# Patient Record
Sex: Female | Born: 1965 | Race: Black or African American | Hispanic: No | Marital: Married | Smoking: Current every day smoker
Health system: Southern US, Community
[De-identification: ages and names within clinical notes are randomized; demographics above are authoritative.]

## PROBLEM LIST (undated history)

## (undated) DIAGNOSIS — E119 Type 2 diabetes mellitus without complications: Secondary | ICD-10-CM

## (undated) DIAGNOSIS — I10 Essential (primary) hypertension: Secondary | ICD-10-CM

## (undated) DIAGNOSIS — G4730 Sleep apnea, unspecified: Secondary | ICD-10-CM

## (undated) HISTORY — PX: TUBAL LIGATION: SHX77

## (undated) HISTORY — PX: WISDOM TOOTH EXTRACTION: SHX21

## (undated) HISTORY — PX: CHOLECYSTECTOMY: SHX55

## (undated) HISTORY — PX: TOOTH EXTRACTION: SUR596

---

## 1997-10-10 ENCOUNTER — Other Ambulatory Visit: Admission: RE | Admit: 1997-10-10 | Discharge: 1997-10-10 | Payer: Self-pay | Admitting: Obstetrics & Gynecology

## 1997-10-14 ENCOUNTER — Ambulatory Visit (HOSPITAL_COMMUNITY): Admission: RE | Admit: 1997-10-14 | Discharge: 1997-10-14 | Payer: Self-pay | Admitting: Obstetrics & Gynecology

## 1997-10-17 ENCOUNTER — Ambulatory Visit (HOSPITAL_COMMUNITY): Admission: RE | Admit: 1997-10-17 | Discharge: 1997-10-17 | Payer: Self-pay | Admitting: Obstetrics

## 1997-10-23 ENCOUNTER — Ambulatory Visit (HOSPITAL_COMMUNITY): Admission: RE | Admit: 1997-10-23 | Discharge: 1997-10-23 | Payer: Self-pay | Admitting: Obstetrics

## 1997-11-04 ENCOUNTER — Ambulatory Visit (HOSPITAL_COMMUNITY): Admission: RE | Admit: 1997-11-04 | Discharge: 1997-11-04 | Payer: Self-pay | Admitting: Obstetrics

## 1998-03-28 ENCOUNTER — Emergency Department (HOSPITAL_COMMUNITY): Admission: EM | Admit: 1998-03-28 | Discharge: 1998-03-29 | Payer: Self-pay | Admitting: Emergency Medicine

## 1998-03-29 ENCOUNTER — Encounter: Payer: Self-pay | Admitting: Emergency Medicine

## 1998-11-25 ENCOUNTER — Inpatient Hospital Stay (HOSPITAL_COMMUNITY): Admission: AD | Admit: 1998-11-25 | Discharge: 1998-11-25 | Payer: Self-pay | Admitting: Obstetrics

## 1998-12-16 ENCOUNTER — Ambulatory Visit (HOSPITAL_COMMUNITY): Admission: RE | Admit: 1998-12-16 | Discharge: 1998-12-16 | Payer: Self-pay | Admitting: Obstetrics & Gynecology

## 1999-08-07 ENCOUNTER — Ambulatory Visit (HOSPITAL_COMMUNITY): Admission: RE | Admit: 1999-08-07 | Discharge: 1999-08-07 | Payer: Self-pay | Admitting: Family Medicine

## 1999-08-07 ENCOUNTER — Encounter: Payer: Self-pay | Admitting: Family Medicine

## 1999-08-17 ENCOUNTER — Ambulatory Visit (HOSPITAL_COMMUNITY): Admission: RE | Admit: 1999-08-17 | Discharge: 1999-08-17 | Payer: Self-pay | Admitting: Family Medicine

## 1999-08-18 ENCOUNTER — Encounter: Payer: Self-pay | Admitting: Family Medicine

## 2000-01-23 ENCOUNTER — Ambulatory Visit (HOSPITAL_BASED_OUTPATIENT_CLINIC_OR_DEPARTMENT_OTHER): Admission: RE | Admit: 2000-01-23 | Discharge: 2000-01-23 | Payer: Self-pay | Admitting: Family Medicine

## 2000-08-04 ENCOUNTER — Encounter: Payer: Self-pay | Admitting: Family Medicine

## 2000-08-04 ENCOUNTER — Ambulatory Visit (HOSPITAL_COMMUNITY): Admission: RE | Admit: 2000-08-04 | Discharge: 2000-08-04 | Payer: Self-pay | Admitting: Family Medicine

## 2001-05-30 ENCOUNTER — Other Ambulatory Visit: Admission: RE | Admit: 2001-05-30 | Discharge: 2001-05-30 | Payer: Self-pay | Admitting: Obstetrics and Gynecology

## 2002-01-26 ENCOUNTER — Emergency Department (HOSPITAL_COMMUNITY): Admission: EM | Admit: 2002-01-26 | Discharge: 2002-01-27 | Payer: Self-pay | Admitting: Emergency Medicine

## 2002-01-27 ENCOUNTER — Emergency Department (HOSPITAL_COMMUNITY): Admission: EM | Admit: 2002-01-27 | Discharge: 2002-01-27 | Payer: Self-pay | Admitting: Emergency Medicine

## 2002-01-27 ENCOUNTER — Ambulatory Visit (HOSPITAL_COMMUNITY): Admission: RE | Admit: 2002-01-27 | Discharge: 2002-01-27 | Payer: Self-pay | Admitting: *Deleted

## 2002-01-27 ENCOUNTER — Encounter: Payer: Self-pay | Admitting: *Deleted

## 2002-02-01 ENCOUNTER — Encounter (HOSPITAL_BASED_OUTPATIENT_CLINIC_OR_DEPARTMENT_OTHER): Payer: Self-pay | Admitting: General Surgery

## 2002-02-02 ENCOUNTER — Encounter (INDEPENDENT_AMBULATORY_CARE_PROVIDER_SITE_OTHER): Payer: Self-pay | Admitting: Specialist

## 2002-02-02 ENCOUNTER — Ambulatory Visit (HOSPITAL_COMMUNITY): Admission: RE | Admit: 2002-02-02 | Discharge: 2002-02-03 | Payer: Self-pay | Admitting: General Surgery

## 2002-02-02 ENCOUNTER — Encounter (HOSPITAL_BASED_OUTPATIENT_CLINIC_OR_DEPARTMENT_OTHER): Payer: Self-pay | Admitting: General Surgery

## 2002-06-15 ENCOUNTER — Other Ambulatory Visit: Admission: RE | Admit: 2002-06-15 | Discharge: 2002-06-15 | Payer: Self-pay | Admitting: Obstetrics & Gynecology

## 2003-07-02 ENCOUNTER — Other Ambulatory Visit: Admission: RE | Admit: 2003-07-02 | Discharge: 2003-07-02 | Payer: Self-pay | Admitting: Obstetrics & Gynecology

## 2003-08-13 ENCOUNTER — Emergency Department (HOSPITAL_COMMUNITY): Admission: EM | Admit: 2003-08-13 | Discharge: 2003-08-14 | Payer: Self-pay | Admitting: Emergency Medicine

## 2003-10-14 ENCOUNTER — Emergency Department (HOSPITAL_COMMUNITY): Admission: EM | Admit: 2003-10-14 | Discharge: 2003-10-14 | Payer: Self-pay | Admitting: *Deleted

## 2004-07-24 ENCOUNTER — Other Ambulatory Visit: Admission: RE | Admit: 2004-07-24 | Discharge: 2004-07-24 | Payer: Self-pay | Admitting: Obstetrics & Gynecology

## 2004-10-23 ENCOUNTER — Emergency Department (HOSPITAL_COMMUNITY): Admission: EM | Admit: 2004-10-23 | Discharge: 2004-10-24 | Payer: Self-pay | Admitting: Emergency Medicine

## 2005-07-29 ENCOUNTER — Other Ambulatory Visit: Admission: RE | Admit: 2005-07-29 | Discharge: 2005-07-29 | Payer: Self-pay | Admitting: Obstetrics & Gynecology

## 2005-11-13 ENCOUNTER — Emergency Department (HOSPITAL_COMMUNITY): Admission: EM | Admit: 2005-11-13 | Discharge: 2005-11-14 | Payer: Self-pay | Admitting: Emergency Medicine

## 2005-11-18 ENCOUNTER — Encounter: Admission: RE | Admit: 2005-11-18 | Discharge: 2005-11-18 | Payer: Self-pay | Admitting: Family Medicine

## 2006-07-28 ENCOUNTER — Emergency Department (HOSPITAL_COMMUNITY): Admission: EM | Admit: 2006-07-28 | Discharge: 2006-07-29 | Payer: Self-pay | Admitting: Emergency Medicine

## 2007-03-26 ENCOUNTER — Ambulatory Visit (HOSPITAL_BASED_OUTPATIENT_CLINIC_OR_DEPARTMENT_OTHER): Admission: RE | Admit: 2007-03-26 | Discharge: 2007-03-26 | Payer: Self-pay | Admitting: Otolaryngology

## 2007-04-06 ENCOUNTER — Ambulatory Visit: Payer: Self-pay | Admitting: Internal Medicine

## 2007-06-05 ENCOUNTER — Encounter: Admission: RE | Admit: 2007-06-05 | Discharge: 2007-06-05 | Payer: Self-pay | Admitting: Otolaryngology

## 2010-07-19 ENCOUNTER — Encounter: Payer: Self-pay | Admitting: Family Medicine

## 2010-11-10 NOTE — Procedures (Signed)
Amanda Walker, Amanda Walker               ACCOUNT NO.:  [id number]   MEDICAL RECORD NO.:  [id number]          PATIENT TYPE:  OUT   LOCATION:  SLEEP CENTER                 FACILITY:  [facility]   PHYSICIAN:  Clinton D. Maple Hudson, MD, FCCP, FACPDATE OF BIRTH:  Apr 18, 1965   DATE OF STUDY:  03/26/2007                            NOCTURNAL POLYSOMNOGRAM   REFERRING PHYSICIAN:  Hermelinda Medicus, M.D.   INDICATIONS FOR PROCEDURE:  Hypersomnia with sleep apnea.   RESULTS:  Epward sleepiness score 11/24. Height 5 feet 6 inches. Weight  221 pounds.   HOME MEDICATIONS:  Listed and reviewed.   SLEEP ARCHITECTURE:  Total sleep time 389 minutes with sleep efficiency  86%. Stage 1 was 6%; Stage 2, 67%; and Stage 3, 10%. REM 17% of total  sleep time. Sleep latency 34 minutes. REM latency 101 minutes. Awake  after sleep onset, 31 minutes. Arousal index 19.3.   MEDICATIONS:  No bedtime medication was taken.   RESPIRATORY DATA:  Apnea hypopnea index (AHI, RDI) 1.2 obstructive  events per hour, which is within normal limits (normal range 0 to 5 per  hour.) This reflects a total of 8 hypopnea's. Events were not  positional. REM AHI 1.2.   OXYGEN DATA:  Moderate snoring with oxygen desaturation to a nadir of  91%. Mean oxygen saturation through the study was 96% on room air.   CARDIAC DATA:  Normal sinus rhythm.   MOVEMENT/PARASOMNIA:  Occasional limb jerk, insignificant. The patient  complained of being hot and sweating, although thermostat was turned  down and a fan added in the room. No bathroom trips.   IMPRESSION/RECOMMENDATIONS:  Occasional obstructive respiratory event,  AHI 1.2 per hour (normal range 0 to 5 per hour.) Moderate snoring, non-  positional events with oxygen desaturation to a nadir of 91%.     Clinton D. Maple Hudson, MD, [facility], FACP  Diplomate, Biomedical engineer of Sleep Medicine  Electronically Signed    CDY/MEDQ  D:  04/06/2007 15:51:07  T:  04/07/2007 08:53:28  Job:  [id number]

## 2010-11-13 NOTE — Op Note (Signed)
**Note Amanda Walker** [name], MIMBS                           ACCOUNT NO.:  [id number]   MEDICAL RECORD NO.:  [id number]                   PATIENT TYPE:  OIB   LOCATION:  5731                                 FACILITY:  [facility]   PHYSICIAN:  Luisa Hart L. Lurene Shadow, M.D.             DATE OF BIRTH:  1966/06/09   DATE OF PROCEDURE:  02/02/2002  DATE OF DISCHARGE:  02/03/2002                                 OPERATIVE REPORT   PREOPERATIVE DIAGNOSES:  Chronic calculous cholecystitis.   POSTOPERATIVE DIAGNOSES:  Chronic calculous cholecystitis.   OPERATION PERFORMED:  Laparoscopic cholecystectomy with intraoperative  cholangiogram.   SURGEON:  Luisa Hart L. Lurene Shadow, M.D.   ASSISTANT:  Marnee Spring. Wiliam Ke, M.D.   ANESTHESIA:  General.   INDICATIONS FOR PROCEDURE:  The patient is a 45 year old woman presenting  with severe and acute epigastric and right upper quadrant pain associated  with nausea and vomiting.  She was evaluated in the emergency room.  Ultrasound showed a thick-walled gallbladder with stones.  She had  leukocytosis 16,000 and some moderate hemoconcentration due to her vomiting.  LFTs and amylase and lipase were all normal.  She comes to the operating  room now after the risks and benefits of cholecystectomy had been fully  discussed with her and she gives her consent.   DESCRIPTION OF PROCEDURE:  Following the induction of satisfactory general  anesthesia, the patient was positioned supinely.  The abdomen was routinely  prepped and draped to be included in a sterile operative field.  Open  laparoscopy created at the umbilicus with the insertion of a Hasson cannula  and insufflation of the peritoneal cavity with 15 mmHg. pressure.  Visual  exploration of the abdomen was carried out.  The liver edges were sharp.  There were multiple adhesions over the dome of the liver possibly from some  previous pelvic inflammatory disease. The gallbladder was chronically  scarred.  None of the small or large  intestine viewed appeared to be  abnormal.  The pelvic organs were not visualized.  Under direct vision,  epigastric and lateral ports were placed.  The gallbladder was grasped and  retracted cephalad with dissection carried down in the region of the  hepatoduodenal ligament with isolation of the cystic artery and cystic duct.  The cystic artery being traced up to its entry in the gallbladder wall and  the cystic duct being traced to the gallbladder cystic duct junction.  The  cystic artery was doubly clipped and transected and the cystic duct clipped  proximally and opened.  Upon opening the cystic duct, a Reddick catheter was  placed into the cystic duct and a cholangiogram carried out with one half  strength Hypaque showing free flow of contrast into a normal caliber biliary  system and normal emptying of the contrast into the duodenum.  The  cystic  duct catheter was then removed.  The cystic duct was doubly clipped and  transected.  The gallbladder was dissected free from the liver bed using  electrocautery and maintaining hemostasis throughout the entire course of  dissection with electrocautery.  At the end of the dissection, the liver bed  was again inspected and all areas of dissection checked for hemostasis  anastomosis and noted to be dry.  The gallbladder was then retrieved through  the umbilical wound and forwarded for pathologic evaluation.  The trocars  were removed under direct vision.  The pneumoperitoneum allowed to deflate  and the wound was closed in layers as follows.  Umbilical wound in two  layers with 0 Dexon and 4-0 Dexon.  Epigastric and lateral flank wounds were  closed with 4-0 Dexon.  All wounds reinforced with Steri-Strips and sterile  dressings applied.  Anesthetic was reversed and the patient removed from the  operating room to the recovery room in stable condition, having tolerated  the procedure well.                                               [name], M.D.    PLB/MEDQ  D:  02/02/2002  T:  02/05/2002  Job:  [id number]

## 2011-04-12 ENCOUNTER — Emergency Department (HOSPITAL_COMMUNITY)
Admission: EM | Admit: 2011-04-12 | Discharge: 2011-04-12 | Disposition: A | Discharge status: Home / Self Care | Payer: No Typology Code available for payment source | Attending: Emergency Medicine | Admitting: Emergency Medicine

## 2011-04-12 DIAGNOSIS — K219 Gastro-esophageal reflux disease without esophagitis: Secondary | ICD-10-CM | POA: Insufficient documentation

## 2011-04-12 DIAGNOSIS — Z79899 Other long term (current) drug therapy: Secondary | ICD-10-CM | POA: Insufficient documentation

## 2011-04-12 DIAGNOSIS — E119 Type 2 diabetes mellitus without complications: Secondary | ICD-10-CM | POA: Insufficient documentation

## 2011-04-12 DIAGNOSIS — S139XXA Sprain of joints and ligaments of unspecified parts of neck, initial encounter: Secondary | ICD-10-CM | POA: Insufficient documentation

## 2011-04-12 DIAGNOSIS — M542 Cervicalgia: Secondary | ICD-10-CM | POA: Insufficient documentation

## 2011-04-12 DIAGNOSIS — Y92410 Unspecified street and highway as the place of occurrence of the external cause: Secondary | ICD-10-CM | POA: Insufficient documentation

## 2014-01-03 ENCOUNTER — Encounter (HOSPITAL_COMMUNITY): Payer: Self-pay | Admitting: Pharmacist

## 2014-01-08 ENCOUNTER — Other Ambulatory Visit: Payer: Self-pay | Admitting: Obstetrics and Gynecology

## 2014-01-14 ENCOUNTER — Encounter (HOSPITAL_COMMUNITY): Payer: Self-pay

## 2014-01-14 ENCOUNTER — Encounter (HOSPITAL_COMMUNITY)
Admission: RE | Admit: 2014-01-14 | Discharge: 2014-01-14 | Disposition: A | Discharge status: Home / Self Care | Payer: No Typology Code available for payment source | Source: Ambulatory Visit | Attending: Obstetrics and Gynecology | Admitting: Obstetrics and Gynecology

## 2014-01-14 HISTORY — DX: Type 2 diabetes mellitus without complications: E11.9

## 2014-01-14 HISTORY — DX: Sleep apnea, unspecified: G47.30

## 2014-01-14 LAB — CBC
HCT: 38.6 % (ref 36.0–46.0)
Hemoglobin: 12.7 g/dL (ref 12.0–15.0)
MCH: 28.9 pg (ref 26.0–34.0)
MCHC: 32.9 g/dL (ref 30.0–36.0)
MCV: 87.7 fL (ref 78.0–100.0)
PLATELETS: 386 10*3/uL (ref 150–400)
RBC: 4.4 MIL/uL (ref 3.87–5.11)
RDW: 14.9 % (ref 11.5–15.5)
WBC: 8.3 10*3/uL (ref 4.0–10.5)

## 2014-01-14 LAB — BASIC METABOLIC PANEL
Anion gap: 11 (ref 5–15)
BUN: 6 mg/dL (ref 6–23)
CHLORIDE: 104 meq/L (ref 96–112)
CO2: 24 meq/L (ref 19–32)
Calcium: 9.7 mg/dL (ref 8.4–10.5)
Creatinine, Ser: 0.66 mg/dL (ref 0.50–1.10)
GFR calc non Af Amer: >90 mL/min (ref >90)
GLUCOSE: 137 mg/dL — AB (ref 70–99)
POTASSIUM: 4.5 meq/L (ref 3.7–5.3)
Sodium: 139 mEq/L (ref 137–147)

## 2014-01-14 NOTE — Pre-Procedure Instructions (Deleted)
   Your procedure is scheduled on:  Thursday, July 23  Enter through the [employer] [facility] at:  [place] up the phone at the desk and dial [phone] and inform us of your arrival.  Please call this number if you have any problems the morning of surgery: [phone]  Remember: Do not eat or drink after midnight: Wednesday Take these medicines the morning of surgery with a SIP OF WATER:  Do not wear jewelry, make-up, or FINGER nail polish No metal in your hair or on your body. Do not wear lotions, powders, perfumes.  You may wear deodorant.  Do not bring valuables to the hospital. Contacts, dentures or bridgework may not be worn into surgery.  Patients discharged on the day of surgery will not be allowed to drive home.  Home with husband Marchia Bond cell [phone].

## 2014-01-14 NOTE — Patient Instructions (Addendum)
   Your procedure is scheduled on:  Thursday, July 23  Enter through the [employer] [facility] at: [place] up the phone at the desk and dial [phone] and inform us of your arrival.  Please call this number if you have any problems the morning of surgery: [phone]  Remember: Do not eat or drink after midnight: Wednesday Take these medicines the morning of surgery with a SIP OF WATER:   None.  Do not take diabetes meds on Thursday, day of surgery.  Do not wear jewelry, make-up, or FINGER nail polish No metal in your hair or on your body. Do not wear lotions, powders, perfumes.  You may wear deodorant.  Do not bring valuables to the hospital. Contacts, dentures or bridgework may not be worn into surgery.   Patients discharged on the day of surgery will not be allowed to drive home.  Home with husband Marchia Bond cell [phone].

## 2014-01-17 ENCOUNTER — Ambulatory Visit (HOSPITAL_COMMUNITY): Payer: No Typology Code available for payment source | Admitting: Anesthesiology

## 2014-01-17 ENCOUNTER — Ambulatory Visit (HOSPITAL_COMMUNITY)
Admission: RE | Admit: 2014-01-17 | Discharge: 2014-01-17 | Disposition: A | Discharge status: Home / Self Care | Payer: No Typology Code available for payment source | Source: Ambulatory Visit | Attending: Obstetrics and Gynecology | Admitting: Obstetrics and Gynecology

## 2014-01-17 ENCOUNTER — Encounter (HOSPITAL_COMMUNITY): Payer: Self-pay | Admitting: Anesthesiology

## 2014-01-17 ENCOUNTER — Encounter (HOSPITAL_COMMUNITY): Payer: No Typology Code available for payment source | Admitting: Anesthesiology

## 2014-01-17 ENCOUNTER — Encounter (HOSPITAL_COMMUNITY)
Admission: RE | Disposition: A | Discharge status: Home / Self Care | Payer: Self-pay | Source: Ambulatory Visit | Attending: Obstetrics and Gynecology

## 2014-01-17 DIAGNOSIS — G4730 Sleep apnea, unspecified: Secondary | ICD-10-CM | POA: Insufficient documentation

## 2014-01-17 DIAGNOSIS — N949 Unspecified condition associated with female genital organs and menstrual cycle: Secondary | ICD-10-CM | POA: Insufficient documentation

## 2014-01-17 DIAGNOSIS — Z6834 Body mass index (BMI) 34.0-34.9, adult: Secondary | ICD-10-CM | POA: Insufficient documentation

## 2014-01-17 DIAGNOSIS — E119 Type 2 diabetes mellitus without complications: Secondary | ICD-10-CM | POA: Insufficient documentation

## 2014-01-17 DIAGNOSIS — N938 Other specified abnormal uterine and vaginal bleeding: Secondary | ICD-10-CM | POA: Insufficient documentation

## 2014-01-17 DIAGNOSIS — N854 Malposition of uterus: Secondary | ICD-10-CM | POA: Insufficient documentation

## 2014-01-17 DIAGNOSIS — F17200 Nicotine dependence, unspecified, uncomplicated: Secondary | ICD-10-CM | POA: Insufficient documentation

## 2014-01-17 DIAGNOSIS — D250 Submucous leiomyoma of uterus: Secondary | ICD-10-CM | POA: Insufficient documentation

## 2014-01-17 HISTORY — PX: DILATATION & CURRETTAGE/HYSTEROSCOPY WITH RESECTOCOPE: SHX5572

## 2014-01-17 LAB — GLUCOSE, CAPILLARY
GLUCOSE-CAPILLARY: 155 mg/dL — AB (ref 70–99)
GLUCOSE-CAPILLARY: 134 mg/dL — AB (ref 70–99)

## 2014-01-17 SURGERY — DILATATION & CURETTAGE/HYSTEROSCOPY WITH RESECTOCOPE
Anesthesia: General | Site: Uterus

## 2014-01-17 MED ORDER — LACTATED RINGERS IV SOLN
INTRAVENOUS | Status: DC
  Administered 2014-01-17 (×3): via INTRAVENOUS

## 2014-01-17 MED ORDER — FENTANYL CITRATE 0.05 MG/ML IJ SOLN
INTRAMUSCULAR | Status: DC | PRN
  Administered 2014-01-17 (×4): 50 ug via INTRAVENOUS

## 2014-01-17 MED ORDER — ONDANSETRON HCL 4 MG/2ML IJ SOLN
INTRAMUSCULAR | Status: DC | PRN
  Administered 2014-01-17: 4 mg via INTRAVENOUS

## 2014-01-17 MED ORDER — CHLOROPROCAINE HCL 1 % IJ SOLN
INTRAMUSCULAR | Status: AC
  Filled 2014-01-17: qty 30

## 2014-01-17 MED ORDER — PROPOFOL 10 MG/ML IV EMUL
INTRAVENOUS | Status: AC
  Filled 2014-01-17: qty 20

## 2014-01-17 MED ORDER — GLYCINE 1.5 % IR SOLN
Status: DC | PRN
  Administered 2014-01-17: 3000 mL

## 2014-01-17 MED ORDER — FENTANYL CITRATE 0.05 MG/ML IJ SOLN
INTRAMUSCULAR | Status: AC
Start: 2014-01-17 — End: 2014-01-17
  Filled 2014-01-17: qty 2

## 2014-01-17 MED ORDER — IBUPROFEN 800 MG PO TABS: 800.0000 mg | ORAL_TABLET | Freq: Three times a day (TID) | ORAL | Status: DC | PRN

## 2014-01-17 MED ORDER — LIDOCAINE HCL (CARDIAC) 20 MG/ML IV SOLN
INTRAVENOUS | Status: DC | PRN
  Administered 2014-01-17 (×2): 30 mg via INTRAVENOUS

## 2014-01-17 MED ORDER — FENTANYL CITRATE 0.05 MG/ML IJ SOLN
INTRAMUSCULAR | Status: AC
  Administered 2014-01-17: 25 ug via INTRAVENOUS
  Filled 2014-01-17: qty 2

## 2014-01-17 MED ORDER — KETOROLAC TROMETHAMINE 30 MG/ML IJ SOLN
INTRAMUSCULAR | Status: AC
  Filled 2014-01-17: qty 1

## 2014-01-17 MED ORDER — LIDOCAINE HCL (CARDIAC) 20 MG/ML IV SOLN
INTRAVENOUS | Status: AC
  Filled 2014-01-17: qty 5

## 2014-01-17 MED ORDER — MIDAZOLAM HCL 2 MG/2ML IJ SOLN
INTRAMUSCULAR | Status: AC
  Filled 2014-01-17: qty 2

## 2014-01-17 MED ORDER — FENTANYL CITRATE 0.05 MG/ML IJ SOLN
25.0000 ug | INTRAMUSCULAR | Status: DC | PRN
  Administered 2014-01-17 (×4): 25 ug via INTRAVENOUS

## 2014-01-17 MED ORDER — ACETAMINOPHEN 160 MG/5ML PO SOLN
ORAL | Status: AC
  Filled 2014-01-17: qty 40.6

## 2014-01-17 MED ORDER — ONDANSETRON HCL 4 MG/2ML IJ SOLN
INTRAMUSCULAR | Status: AC
  Filled 2014-01-17: qty 2

## 2014-01-17 MED ORDER — PROPOFOL 10 MG/ML IV BOLUS
INTRAVENOUS | Status: DC | PRN
  Administered 2014-01-17: 170 mg via INTRAVENOUS

## 2014-01-17 MED ORDER — MIDAZOLAM HCL 2 MG/2ML IJ SOLN
INTRAMUSCULAR | Status: DC | PRN
  Administered 2014-01-17: 1 mg via INTRAVENOUS

## 2014-01-17 MED ORDER — FENTANYL CITRATE 0.05 MG/ML IJ SOLN
INTRAMUSCULAR | Status: AC
  Filled 2014-01-17: qty 2

## 2014-01-17 MED ORDER — KETOROLAC TROMETHAMINE 30 MG/ML IJ SOLN
INTRAMUSCULAR | Status: DC | PRN
  Administered 2014-01-17 (×2): 30 mg via INTRAVENOUS

## 2014-01-17 MED ORDER — ACETAMINOPHEN 160 MG/5ML PO SOLN
1000.0000 mg | Freq: Once | ORAL | Status: AC
  Administered 2014-01-17: 1000 mg via ORAL

## 2014-01-17 MED ORDER — ONDANSETRON HCL 4 MG/2ML IJ SOLN
4.0000 mg | Freq: Once | INTRAMUSCULAR | Status: AC
  Administered 2014-01-17: 4 mg via INTRAVENOUS

## 2014-01-17 SURGICAL SUPPLY — 20 items
CANISTER SUCT 3000ML (MISCELLANEOUS) ×3 IMPLANT
CATH ROBINSON RED A/P 16FR (CATHETERS) ×3 IMPLANT
CLOTH BEACON ORANGE TIMEOUT ST (SAFETY) ×3 IMPLANT
CONTAINER PREFILL 10% NBF 60ML (FORM) ×6 IMPLANT
DRAPE HYSTEROSCOPY (DRAPE) ×3 IMPLANT
DRSG TELFA 3X8 NADH (GAUZE/BANDAGES/DRESSINGS) ×3 IMPLANT
ELECT REM PT RETURN 9FT ADLT (ELECTROSURGICAL) ×3
ELECTRODE REM PT RTRN 9FT ADLT (ELECTROSURGICAL) ×1 IMPLANT
GLOVE BIOGEL PI IND STRL 7.0 (GLOVE) ×2 IMPLANT
GLOVE BIOGEL PI INDICATOR 7.0 (GLOVE) ×4
GLOVE ECLIPSE 6.5 STRL STRAW (GLOVE) ×3 IMPLANT
GOWN STRL REUS W/TWL LRG LVL3 (GOWN DISPOSABLE) ×6 IMPLANT
LOOP ANGLED CUTTING 22FR (CUTTING LOOP) IMPLANT
PACK VAGINAL MINOR WOMEN LF (CUSTOM PROCEDURE TRAY) ×3 IMPLANT
PAD DRESSING TELFA 3X8 NADH (GAUZE/BANDAGES/DRESSINGS) ×1 IMPLANT
PAD OB MATERNITY 4.3X12.25 (PERSONAL CARE ITEMS) ×3 IMPLANT
SET TUBING HYSTEROSCOPY 2 NDL (TUBING) ×2 IMPLANT
TOWEL OR 17X24 6PK STRL BLUE (TOWEL DISPOSABLE) ×6 IMPLANT
TUBE HYSTEROSCOPY W Y-CONNECT (TUBING) ×2 IMPLANT
WATER STERILE IRR 1000ML POUR (IV SOLUTION) ×1 IMPLANT

## 2014-01-17 NOTE — Brief Op Note (Signed)
01/17/2014  11:32 AM  PATIENT:  Amanda Walker  48 y.o. female  PRE-OPERATIVE DIAGNOSIS:  Dysfunctional Uterine Bleeding, Possible Endometrial Polyp  POST-OPERATIVE DIAGNOSIS:  Dysfunctional Uterine Bleeding, Possible Submucosal fibroid  PROCEDURE:  Diagnostic hysteroscopy, dilation and curettage, hysteroscopic resection of Sm fibroid  SURGEON:  Surgeon(s) and Role:    * Suzann Lazaro Clint Bolder, MD - Primary  PHYSICIAN ASSISTANT:   ASSISTANTS: none   ANESTHESIA:   general  EBL:  Total I/O In: -  Out: 35 [Urine:35]  BLOOD ADMINISTERED:none  DRAINS: none   LOCAL MEDICATIONS USED:  NONE  SPECIMEN:  Source of Specimen:  EMC with fibroid resection  DISPOSITION OF SPECIMEN:  PATHOLOGY  COUNTS:  YES  TOURNIQUET:  * No tourniquets in log *  DICTATION: .Other Dictation: Dictation Number [id number]  PLAN OF CARE: Discharge to home after PACU  PATIENT DISPOSITION:  PACU - hemodynamically stable.   Delay start of Pharmacological VTE agent (>24hrs) due to surgical blood loss or risk of bleeding: no

## 2014-01-17 NOTE — Anesthesia Preprocedure Evaluation (Signed)
Anesthesia Evaluation  Patient identified by MRN, date of birth, ID band Patient awake    Reviewed: Allergy & Precautions, H&P , Patient's Chart, lab work & pertinent test results, reviewed documented beta blocker date and time   Airway Mallampati: III TM Distance: >3 FB Neck ROM: full    Dental no notable dental hx.    Pulmonary sleep apnea (non-compliant with CPAP mask) , Current Smoker,  breath sounds clear to auscultation  Pulmonary exam normal       Cardiovascular Rhythm:regular Rate:Normal     Neuro/Psych    GI/Hepatic   Endo/Other  diabetes, Type 2Morbid obesity  Renal/GU      Musculoskeletal   Abdominal   Peds  Hematology   Anesthesia Other Findings   Reproductive/Obstetrics                           Anesthesia Physical Anesthesia Plan  ASA: II  Anesthesia Plan:    Post-op Pain Management:    Induction: Intravenous  Airway Management Planned: LMA  Additional Equipment:   Intra-op Plan:   Post-operative Plan:   Informed Consent: I have reviewed the patients History and Physical, chart, labs and discussed the procedure including the risks, benefits and alternatives for the proposed anesthesia with the patient or authorized representative who has indicated his/her understanding and acceptance.   Dental Advisory Given and Dental advisory given  Plan Discussed with: CRNA and Surgeon  Anesthesia Plan Comments: (Discussed GA with LMA, possible sore throat, potential need to switch to ETT, N/V, pulmonary aspiration. Questions answered. )        Anesthesia Quick Evaluation

## 2014-01-17 NOTE — Anesthesia Postprocedure Evaluation (Signed)
  Anesthesia Post-op Note  Patient: Amanda Walker  Procedure(s) Performed: Procedure(s): Diagnostic HYSTEROSCOPY,  Hysteroscopic resection of fibroid (D&C) (N/A) Patient is awake and responsive. Pain and nausea are reasonably well controlled. Vital signs are stable and clinically acceptable. Oxygen saturation is clinically acceptable. There are no apparent anesthetic complications at this time. Patient is ready for discharge.

## 2014-01-17 NOTE — Discharge Instructions (Signed)
CALL  IF TEMP>100.4, NOTHING PER VAGINA X 1 WK, CALL IF SOAKING A MAXI  PAD EVERY HOUR OR MORE FREQUENTLY Post Anesthesia Home Care Instructions  Activity: Get plenty of rest for the remainder of the day. A responsible adult should stay with you for 24 hours following the procedure.  For the next 24 hours, DO NOT: -Drive a car -Paediatric nurse -Drink alcoholic beverages -Take any medication unless instructed by your physician -Make any legal decisions or sign important papers.  Meals: Start with liquid foods such as gelatin or soup. Progress to regular foods as tolerated. Avoid greasy, spicy, heavy foods. If nausea and/or vomiting occur, drink only clear liquids until the nausea and/or vomiting subsides. Call your physician if vomiting continues.  Special Instructions/Symptoms: Your throat may feel dry or sore from the anesthesia or the breathing tube placed in your throat during surgery. If this causes discomfort, gargle with warm salt water. The discomfort should disappear within 24 hours.   You may take ibuprofen after 5:10 PM today.

## 2014-01-17 NOTE — Transfer of Care (Signed)
Immediate Anesthesia Transfer of Care Note  Patient: Amanda Walker  Procedure(s) Performed: Procedure(s): Diagnostic HYSTEROSCOPY,  Hysteroscopic resection of fibroid (D&C) (N/A)  Patient Location: PACU  Anesthesia Type:General  Level of Consciousness: awake, alert , oriented and patient cooperative  Airway & Oxygen Therapy: Patient Spontanous Breathing and Patient connected to nasal cannula oxygen  Post-op Assessment: Report given to PACU RN and Post -op Vital signs reviewed and stable  Post vital signs: Reviewed and stable  Complications: No apparent anesthesia complications

## 2014-01-18 ENCOUNTER — Encounter (HOSPITAL_COMMUNITY): Payer: Self-pay | Admitting: Obstetrics and Gynecology

## 2014-01-18 NOTE — Op Note (Signed)
NAMETERI, LEGACY               ACCOUNT NO.:  [id number]  MEDICAL RECORD NO.:  [id number]  LOCATION:                                 FACILITY:  PHYSICIAN:  Servando Salina, M.D.DATE OF BIRTH:  [birth year]  DATE OF PROCEDURE:  01/17/2014 DATE OF DISCHARGE:                              OPERATIVE REPORT   PREOPERATIVE DIAGNOSES:  Dysfunction uterine bleeding, endometrial mass.  PROCEDURE:  Diagnostic hysteroscopy, hysteroscopic resection of possible submucosal fibroid, dilation and curettage.  POSTOPERATIVE DIAGNOSES:  Dysfunction uterine bleeding, possible submucosal fibroid.  ANESTHESIA:  General.  SURGEON:  Servando Salina, M.D.  ASSISTANT:  None.  PROCEDURE:  Under adequate general anesthesia, the patient was placed in the dorsal lithotomy position.  She was sterilely prepped and draped in usual fashion.  Bladder catheterized moderate amount of urine. Examination under anesthesia revealed anteverted uterus, slightly irregular.  No adnexal masses could be appreciated.  A bivalve speculum was placed in vagina.  Single-tooth tenaculum was placed on the anterior lip of the cervix.  The cervix was then serially dilated up to #27 [facility] dilator.  The resectoscope with a single loop was inserted into the uterine cavity without incident.  Both tubal ostia could be seen well. There was a slight indentation in the anterior wall of the cavity suggestive of a possible submucosal fibroid.  No endometrial polyps noted.  The resectoscope was then used to resect the small area.  The resectoscope was then removed and the cavity was then curetted.  The procedure was then terminated by removing all instruments.  SPECIMEN:  Endometrial curetting with fibroid resection was sent to Pathology.  ESTIMATED BLOOD LOSS:  Minimal.  FLUID DEFICIT:  25 mL.  COMPLICATION:  None.  The patient tolerated the procedure well, was transferred to recovery in stable condition.     Servando Salina, M.D.     [place]/MEDQ  D:  01/17/2014  T:  01/18/2014  Job:  [id number]

## 2015-06-30 ENCOUNTER — Encounter (HOSPITAL_COMMUNITY): Payer: Self-pay

## 2015-06-30 ENCOUNTER — Emergency Department (HOSPITAL_COMMUNITY): Payer: BLUE CROSS/BLUE SHIELD

## 2015-06-30 ENCOUNTER — Emergency Department (HOSPITAL_COMMUNITY)
Admission: EM | Admit: 2015-06-30 | Discharge: 2015-06-30 | Disposition: A | Discharge status: Home / Self Care | Payer: BLUE CROSS/BLUE SHIELD | Attending: Emergency Medicine | Admitting: Emergency Medicine

## 2015-06-30 DIAGNOSIS — S3992XA Unspecified injury of lower back, initial encounter: Secondary | ICD-10-CM | POA: Diagnosis not present

## 2015-06-30 DIAGNOSIS — Z7982 Long term (current) use of aspirin: Secondary | ICD-10-CM | POA: Diagnosis not present

## 2015-06-30 DIAGNOSIS — R42 Dizziness and giddiness: Secondary | ICD-10-CM

## 2015-06-30 DIAGNOSIS — Y998 Other external cause status: Secondary | ICD-10-CM | POA: Insufficient documentation

## 2015-06-30 DIAGNOSIS — Y9389 Activity, other specified: Secondary | ICD-10-CM | POA: Diagnosis not present

## 2015-06-30 DIAGNOSIS — E119 Type 2 diabetes mellitus without complications: Secondary | ICD-10-CM | POA: Diagnosis not present

## 2015-06-30 DIAGNOSIS — Z8669 Personal history of other diseases of the nervous system and sense organs: Secondary | ICD-10-CM | POA: Diagnosis not present

## 2015-06-30 DIAGNOSIS — F17210 Nicotine dependence, cigarettes, uncomplicated: Secondary | ICD-10-CM | POA: Diagnosis not present

## 2015-06-30 DIAGNOSIS — M25561 Pain in right knee: Secondary | ICD-10-CM

## 2015-06-30 DIAGNOSIS — S8991XA Unspecified injury of right lower leg, initial encounter: Secondary | ICD-10-CM | POA: Diagnosis not present

## 2015-06-30 DIAGNOSIS — S161XXA Strain of muscle, fascia and tendon at neck level, initial encounter: Secondary | ICD-10-CM | POA: Diagnosis not present

## 2015-06-30 DIAGNOSIS — Z79899 Other long term (current) drug therapy: Secondary | ICD-10-CM | POA: Insufficient documentation

## 2015-06-30 DIAGNOSIS — S0990XA Unspecified injury of head, initial encounter: Secondary | ICD-10-CM | POA: Diagnosis present

## 2015-06-30 DIAGNOSIS — Y92410 Unspecified street and highway as the place of occurrence of the external cause: Secondary | ICD-10-CM | POA: Insufficient documentation

## 2015-06-30 DIAGNOSIS — S060X0A Concussion without loss of consciousness, initial encounter: Secondary | ICD-10-CM | POA: Diagnosis not present

## 2015-06-30 MED ORDER — MECLIZINE HCL 25 MG PO TABS: 25.0000 mg | ORAL_TABLET | Freq: Three times a day (TID) | ORAL | Status: DC | PRN

## 2015-06-30 MED ORDER — DIAZEPAM 2 MG PO TABS: 2.0000 mg | ORAL_TABLET | Freq: Four times a day (QID) | ORAL | Status: DC | PRN

## 2015-06-30 MED ORDER — TRAMADOL HCL 50 MG PO TABS: 50.0000 mg | ORAL_TABLET | Freq: Four times a day (QID) | ORAL | Status: DC | PRN

## 2015-06-30 MED ORDER — CYCLOBENZAPRINE HCL 5 MG PO TABS: 5.0000 mg | ORAL_TABLET | Freq: Three times a day (TID) | ORAL | Status: DC | PRN

## 2015-06-30 NOTE — Discharge Instructions (Signed)
Concussion, Adult A concussion, or closed-head injury, is a brain injury caused by a direct blow to the head or by a quick and sudden movement (jolt) of the head or neck. Concussions are usually not life-threatening. Even so, the effects of a concussion can be serious. If you have had a concussion before, you are more likely to experience concussion-like symptoms after a direct blow to the head.  CAUSES  Direct blow to the head, such as from running into another player during a soccer game, being hit in a fight, or hitting your head on a hard surface.  A jolt of the head or neck that causes the brain to move back and forth inside the skull, such as in a car crash. SIGNS AND SYMPTOMS The signs of a concussion can be hard to notice. Early on, they may be missed by you, family members, and health care providers. You may look fine but act or feel differently. Symptoms are usually temporary, but they may last for days, weeks, or even longer. Some symptoms may appear right away while others may not show up for hours or days. Every head injury is different. Symptoms include:  Mild to moderate headaches that will not go away.  A feeling of pressure inside your head.  Having more trouble than usual:  Learning or remembering things you have heard.  Answering questions.  Paying attention or concentrating.  Organizing daily tasks.  Making decisions and solving problems.  Slowness in thinking, acting or reacting, speaking, or reading.  Getting lost or being easily confused.  Feeling tired all the time or lacking energy (fatigued).  Feeling drowsy.  Sleep disturbances.  Sleeping more than usual.  Sleeping less than usual.  Trouble falling asleep.  Trouble sleeping (insomnia).  Loss of balance or feeling lightheaded or dizzy.  Nausea or vomiting.  Numbness or tingling.  Increased sensitivity to:  Sounds.  Lights.  Distractions.  Vision problems or eyes that tire  easily.  Diminished sense of taste or smell.  Ringing in the ears.  Mood changes such as feeling sad or anxious.  Becoming easily irritated or angry for little or no reason.  Lack of motivation.  Seeing or hearing things other people do not see or hear (hallucinations). DIAGNOSIS Your health care provider can usually diagnose a concussion based on a description of your injury and symptoms. He or she will ask whether you passed out (lost consciousness) and whether you are having trouble remembering events that happened right before and during your injury. Your evaluation might include:  A brain scan to look for signs of injury to the brain. Even if the test shows no injury, you may still have a concussion.  Blood tests to be sure other problems are not present. TREATMENT  Concussions are usually treated in an emergency department, in urgent care, or at a clinic. You may need to stay in the hospital overnight for further treatment.  Tell your health care provider if you are taking any medicines, including prescription medicines, over-the-counter medicines, and natural remedies. Some medicines, such as blood thinners (anticoagulants) and aspirin, may increase the chance of complications. Also tell your health care provider whether you have had alcohol or are taking illegal drugs. This information may affect treatment.  Your health care provider will send you home with important instructions to follow.  How fast you will recover from a concussion depends on many factors. These factors include how severe your concussion is, what part of your brain was injured,  your age, and how healthy you were before the concussion.  Most people with mild injuries recover fully. Recovery can take time. In general, recovery is slower in older persons. Also, persons who have had a concussion in the past or have other medical problems may find that it takes longer to recover from their current injury. HOME  CARE INSTRUCTIONS General Instructions  Carefully follow the directions your health care provider gave you.  Only take over-the-counter or prescription medicines for pain, discomfort, or fever as directed by your health care provider.  Take only those medicines that your health care provider has approved.  Do not drink alcohol until your health care provider says you are well enough to do so. Alcohol and certain other drugs may slow your recovery and can put you at risk of further injury.  If it is harder than usual to remember things, write them down.  If you are easily distracted, try to do one thing at a time. For example, do not try to watch TV while fixing dinner.  Talk with family members or close friends when making important decisions.  Keep all follow-up appointments. Repeated evaluation of your symptoms is recommended for your recovery.  Watch your symptoms and tell others to do the same. Complications sometimes occur after a concussion. Older adults with a brain injury may have a higher risk of serious complications, such as a blood clot on the brain.  Tell your teachers, school nurse, school counselor, coach, athletic trainer, or work Freight forwarder about your injury, symptoms, and restrictions. Tell them about what you can or cannot do. They should watch for:  Increased problems with attention or concentration.  Increased difficulty remembering or learning new information.  Increased time needed to complete tasks or assignments.  Increased irritability or decreased ability to cope with stress.  Increased symptoms.  Rest. Rest helps the brain to heal. Make sure you:  Get plenty of sleep at night. Avoid staying up late at night.  Keep the same bedtime hours on weekends and weekdays.  Rest during the day. Take daytime naps or rest breaks when you feel tired.  Limit activities that require a lot of thought or concentration. These include:  Doing homework or job-related  work.  Watching TV.  Working on the computer.  Avoid any situation where there is potential for another head injury (football, hockey, soccer, basketball, martial arts, downhill snow sports and horseback riding). Your condition will get worse every time you experience a concussion. You should avoid these activities until you are evaluated by the appropriate follow-up health care providers. Returning To Your Regular Activities You will need to return to your normal activities slowly, not all at once. You must give your body and brain enough time for recovery.  Do not return to sports or other athletic activities until your health care provider tells you it is safe to do so.  Ask your health care provider when you can drive, ride a bicycle, or operate heavy machinery. Your ability to react may be slower after a brain injury. Never do these activities if you are dizzy.  Ask your health care provider about when you can return to work or school. Preventing Another Concussion It is very important to avoid another brain injury, especially before you have recovered. In rare cases, another injury can lead to permanent brain damage, brain swelling, or death. The risk of this is greatest during the first 7-10 days after a head injury. Avoid injuries by:  Wearing a  seat belt when riding in a car.  Drinking alcohol only in moderation.  Wearing a helmet when biking, skiing, skateboarding, skating, or doing similar activities.  Avoiding activities that could lead to a second concussion, such as contact or recreational sports, until your health care provider says it is okay.  Taking safety measures in your home.  Remove clutter and tripping hazards from floors and stairways.  Use grab bars in bathrooms and handrails by stairs.  Place non-slip mats on floors and in bathtubs.  Improve lighting in dim areas. SEEK MEDICAL CARE IF:  You have increased problems paying attention or  concentrating.  You have increased difficulty remembering or learning new information.  You need more time to complete tasks or assignments than before.  You have increased irritability or decreased ability to cope with stress.  You have more symptoms than before. Seek medical care if you have any of the following symptoms for more than 2 weeks after your injury:  Lasting (chronic) headaches.  Dizziness or balance problems.  Nausea.  Vision problems.  Increased sensitivity to noise or light.  Depression or mood swings.  Anxiety or irritability.  Memory problems.  Difficulty concentrating or paying attention.  Sleep problems.  Feeling tired all the time. SEEK IMMEDIATE MEDICAL CARE IF:  You have severe or worsening headaches. These may be a sign of a blood clot in the brain.  You have weakness (even if only in one hand, leg, or part of the face).  You have numbness.  You have decreased coordination.  You vomit repeatedly.  You have increased sleepiness.  One pupil is larger than the other.  You have convulsions.  You have slurred speech.  You have increased confusion. This may be a sign of a blood clot in the brain.  You have increased restlessness, agitation, or irritability.  You are unable to recognize people or places.  You have neck pain.  It is difficult to wake you up.  You have unusual behavior changes.  You lose consciousness. MAKE SURE YOU:  Understand these instructions.  Will watch your condition.  Will get help right away if you are not doing well or get worse.   This information is not intended to replace advice given to you by your health care provider. Make sure you discuss any questions you have with your health care provider.   Document Released: 09/04/2003 Document Revised: 07/05/2014 Document Reviewed: 01/04/2013 Elsevier Interactive Patient Education 2016 [employer]  Knee Pain Knee pain is a very common symptom and  can have many causes. Knee pain often goes away when you follow your health care provider's instructions for relieving pain and discomfort at home. However, knee pain can develop into a condition that needs treatment. Some conditions may include:  Arthritis caused by wear and tear (osteoarthritis).  Arthritis caused by swelling and irritation (rheumatoid arthritis or gout).  A cyst or growth in your knee.  An infection in your knee joint.  An injury that will not heal.  Damage, swelling, or irritation of the tissues that support your knee (torn ligaments or tendinitis). If your knee pain continues, additional tests may be ordered to diagnose your condition. Tests may include X-rays or other imaging studies of your knee. You may also need to have fluid removed from your knee. Treatment for ongoing knee pain depends on the cause, but treatment may include:  Medicines to relieve pain or swelling.  Steroid injections in your knee.  Physical therapy.  Surgery. HOME CARE  INSTRUCTIONS  Take medicines only as directed by your health care provider.  Rest your knee and keep it raised (elevated) while you are resting.  Do not do things that cause or worsen pain.  Avoid high-impact activities or exercises, such as running, jumping rope, or doing jumping jacks.  Apply ice to the knee area:  Put ice in a plastic bag.  Place a towel between your skin and the bag.  Leave the ice on for 20 minutes, 2-3 times a day.  Ask your health care provider if you should wear an elastic knee support.  Keep a pillow under your knee when you sleep.  Lose weight if you are overweight. Extra weight can put pressure on your knee.  Do not use any tobacco products, including cigarettes, chewing tobacco, or electronic cigarettes. If you need help quitting, ask your health care provider. Smoking may slow the healing of any bone and joint problems that you may have. SEEK MEDICAL CARE IF:  Your knee pain  continues, changes, or gets worse.  You have a fever along with knee pain.  Your knee buckles or locks up.  Your knee becomes more swollen. SEEK IMMEDIATE MEDICAL CARE IF:   Your knee joint feels hot to the touch.  You have chest pain or trouble breathing.   This information is not intended to replace advice given to you by your health care provider. Make sure you discuss any questions you have with your health care provider.   Document Released: 04/11/2007 Document Revised: 07/05/2014 Document Reviewed: 01/28/2014 Elsevier Interactive Patient Education 2016 [employer]  Post-Concussion Syndrome Post-concussion syndrome describes the symptoms that can occur after a head injury. These symptoms can last from weeks to months. CAUSES  It is not clear why some head injuries cause post-concussion syndrome. It can occur whether your head injury was mild or severe and whether you were wearing head protection or not.  SIGNS AND SYMPTOMS  Memory difficulties.  Dizziness.  Headaches.  Double vision or blurry vision.  Sensitivity to light.  Hearing difficulties.  Depression.  Tiredness.  Weakness.  Difficulty with concentration.  Difficulty sleeping or staying asleep.  Vomiting.  Poor balance or instability on your feet.  Slow reaction time.  Difficulty learning and remembering things you have heard. DIAGNOSIS  There is no test to determine whether you have post-concussion syndrome. Your health care provider may order an imaging scan of your brain, such as a CT scan, to check for other problems that may be causing your symptoms (such as a severe injury inside your skull). TREATMENT  Usually, these problems disappear over time without medical care. Your health care provider may prescribe medicine to help ease your symptoms. It is important to follow up with a neurologist to evaluate your recovery and address any lingering symptoms or issues. HOME CARE INSTRUCTIONS    Take medicines only as directed by your health care provider. Do not take aspirin. Aspirin can slow blood clotting.  Sleep with your head slightly elevated to help with headaches.  Avoid any situation where there is potential for another head injury. This includes football, hockey, soccer, basketball, martial arts, downhill snow sports, and horseback riding. Your condition will get worse every time you experience a concussion. You should avoid these activities until you are evaluated by the appropriate follow-up health care providers.  Keep all follow-up visits as directed by your health care provider. This is important. SEEK MEDICAL CARE IF:  You have increased problems paying attention or concentrating.  You have increased difficulty remembering or learning new information.  You need more time to complete tasks or assignments than before.  You have increased irritability or decreased ability to cope with stress.  You have more symptoms than before. Seek medical care if you have any of the following symptoms for more than two weeks after your injury:  Lasting (chronic) headaches.  Dizziness or balance problems.  Nausea.  Vision problems.  Increased sensitivity to noise or light.  Depression or mood swings.  Anxiety or irritability.  Memory problems.  Difficulty concentrating or paying attention.  Sleep problems.  Feeling tired all the time. SEEK IMMEDIATE MEDICAL CARE IF:  You have confusion or unusual drowsiness.  Others find it difficult to wake you up.  You have nausea or persistent, forceful vomiting.  You feel like you are moving when you are not (vertigo). Your eyes may move rapidly back and forth.  You have convulsions or faint.  You have severe, persistent headaches that are not relieved by medicine.  You cannot use your arms or legs normally.  One of your pupils is larger than the other.  You have clear or bloody discharge from your nose or  ears.  Your problems are getting worse, not better. MAKE SURE YOU:  Understand these instructions.  Will watch your condition.  Will get help right away if you are not doing well or get worse.   This information is not intended to replace advice given to you by your health care provider. Make sure you discuss any questions you have with your health care provider.   Document Released: 12/04/2001 Document Revised: 07/05/2014 Document Reviewed: 09/19/2013 Elsevier Interactive Patient Education [employer].
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
## 2015-06-30 NOTE — ED Notes (Signed)
Ortho tech bedside
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
## 2016-11-22 ENCOUNTER — Emergency Department (HOSPITAL_COMMUNITY)
Admission: EM | Admit: 2016-11-22 | Discharge: 2016-11-22 | Disposition: A | Discharge status: Home / Self Care | Payer: BLUE CROSS/BLUE SHIELD | Attending: Emergency Medicine | Admitting: Emergency Medicine

## 2016-11-22 ENCOUNTER — Emergency Department (HOSPITAL_COMMUNITY): Payer: BLUE CROSS/BLUE SHIELD

## 2016-11-22 ENCOUNTER — Encounter (HOSPITAL_COMMUNITY): Payer: Self-pay | Admitting: Emergency Medicine

## 2016-11-22 DIAGNOSIS — Z7984 Long term (current) use of oral hypoglycemic drugs: Secondary | ICD-10-CM | POA: Diagnosis not present

## 2016-11-22 DIAGNOSIS — F17210 Nicotine dependence, cigarettes, uncomplicated: Secondary | ICD-10-CM | POA: Insufficient documentation

## 2016-11-22 DIAGNOSIS — E119 Type 2 diabetes mellitus without complications: Secondary | ICD-10-CM | POA: Insufficient documentation

## 2016-11-22 DIAGNOSIS — R079 Chest pain, unspecified: Secondary | ICD-10-CM | POA: Diagnosis present

## 2016-11-22 DIAGNOSIS — Z79899 Other long term (current) drug therapy: Secondary | ICD-10-CM | POA: Insufficient documentation

## 2016-11-22 DIAGNOSIS — R0789 Other chest pain: Secondary | ICD-10-CM

## 2016-11-22 DIAGNOSIS — Z7982 Long term (current) use of aspirin: Secondary | ICD-10-CM | POA: Diagnosis not present

## 2016-11-22 LAB — BASIC METABOLIC PANEL
Anion gap: 7 (ref 5–15)
BUN: 15 mg/dL (ref 6–20)
CHLORIDE: 106 mmol/L (ref 101–111)
CO2: 27 mmol/L (ref 22–32)
CREATININE: 0.79 mg/dL (ref 0.44–1.00)
Calcium: 9.2 mg/dL (ref 8.9–10.3)
GFR calc Af Amer: >60 mL/min (ref >60)
GFR calc non Af Amer: >60 mL/min (ref >60)
Glucose, Bld: 115 mg/dL — ABNORMAL HIGH (ref 65–99)
POTASSIUM: 3.7 mmol/L (ref 3.5–5.1)
Sodium: 140 mmol/L (ref 135–145)

## 2016-11-22 LAB — CBC
HEMATOCRIT: 39.2 % (ref 36.0–46.0)
Hemoglobin: 13 g/dL (ref 12.0–15.0)
MCH: 30 pg (ref 26.0–34.0)
MCHC: 33.2 g/dL (ref 30.0–36.0)
MCV: 90.3 fL (ref 78.0–100.0)
Platelets: 399 10*3/uL (ref 150–400)
RBC: 4.34 MIL/uL (ref 3.87–5.11)
RDW: 15.2 % (ref 11.5–15.5)
WBC: 9.8 10*3/uL (ref 4.0–10.5)

## 2016-11-22 LAB — POCT I-STAT TROPONIN I
Troponin i, poc: 0 ng/mL (ref 0.00–0.08)
Troponin i, poc: 0 ng/mL (ref 0.00–0.08)

## 2016-11-22 LAB — LIPASE, BLOOD: Lipase: 47 U/L (ref 11–51)

## 2016-11-22 MED ORDER — KETOROLAC TROMETHAMINE 30 MG/ML IJ SOLN
15.0000 mg | Freq: Once | INTRAMUSCULAR | Status: AC
  Administered 2016-11-22: 15 mg via INTRAMUSCULAR
  Filled 2016-11-22: qty 1

## 2016-11-22 NOTE — ED Provider Notes (Signed)
[facility] DEPT Provider Note   CSN: [id number] Arrival date & time: 11/22/16  1435     History   Chief Complaint Chief Complaint  Patient presents with  . Chest Pain    HPI Amanda Walker is a 51 y.o. female.  HPI  Patient with past medical history of hypertension, hyperlipidemia and diabetes presents with 4 hour history of a "sharp chest pain." States that she has been experiencing this similar type of chest pain intermittently for the past few weeks. She related it to gas at that time. She states this feels different because the pain radiates to her shoulder and hand. She denies any gait changes, numbness or weakness. She also reports mild epigastric abdominal pain as well which she relates to her reflux. Patient denies shortness of breath, headache, vision changes, hemoptysis, leg swelling, OCP use, prior DVT or PE, history of cancer, palpitations, cough, fever, loss of consciousness, syncope, vomiting, diarrhea or constipation.  Past Medical History:  Diagnosis Date  . Diabetes mellitus without complication ([place])    type 2  . Sleep apnea    does not use cpap  . SVD (spontaneous vaginal delivery)    x 3    There are no active problems to display for this patient.   Past Surgical History:  Procedure Laterality Date  . CHOLECYSTECTOMY    . DILATATION & CURRETTAGE/HYSTEROSCOPY WITH RESECTOCOPE N/A 01/17/2014   Procedure: Diagnostic HYSTEROSCOPY,  Hysteroscopic resection of fibroid (D&C);  Surgeon: Marvene Staff, MD;  Location: [facility];  Service: Gynecology;  Laterality: N/A;  . TOOTH EXTRACTION    . TUBAL LIGATION    . WISDOM TOOTH EXTRACTION      OB History    No data available       Home Medications    Prior to Admission medications   Medication Sig Start Date End Date Taking? Authorizing Provider  aspirin EC 81 MG tablet Take 81 mg by mouth daily.   Yes [provider]  lisinopril-hydrochlorothiazide (PRINZIDE,ZESTORETIC) 20-12.5 MG  tablet Take 1 tablet by mouth daily. 10/04/16  Yes [provider]  metFORMIN (GLUCOPHAGE) 500 MG tablet Take 500 mg by mouth daily with breakfast.   Yes [provider]  omeprazole (PRILOSEC) 20 MG capsule Take 20 mg by mouth 2 (two) times daily. 10/12/16  Yes [provider]  pravastatin (PRAVACHOL) 20 MG tablet Take 20 mg by mouth daily. 10/22/16  Yes [provider]  cyclobenzaprine (FLEXERIL) 5 MG tablet Take 1 tablet (5 mg total) by mouth 3 (three) times daily as needed for muscle spasms. Patient not taking: Reported on 11/22/2016 06/30/15   Margarita Mail, PA-C  diazepam (VALIUM) 2 MG tablet Take 1 tablet (2 mg total) by mouth every 6 (six) hours as needed (dizziness). Patient not taking: Reported on 11/22/2016 06/30/15   Margarita Mail, PA-C  ibuprofen (ADVIL,MOTRIN) 800 MG tablet Take 1 tablet (800 mg total) by mouth every 8 (eight) hours as needed. Patient not taking: Reported on 06/30/2015 01/17/14   Servando Salina, MD  meclizine (ANTIVERT) 25 MG tablet Take 1 tablet (25 mg total) by mouth 3 (three) times daily as needed for dizziness. Patient not taking: Reported on 11/22/2016 06/30/15   Margarita Mail, PA-C  traMADol (ULTRAM) 50 MG tablet Take 1 tablet (50 mg total) by mouth every 6 (six) hours as needed. Patient not taking: Reported on 11/22/2016 06/30/15   Margarita Mail, PA-C    Family History No family history on file.  Social History Social History  Substance Use Topics  . Smoking status: Current Every Day Smoker    Packs/day: 0.50    Years: 15.00    Types: Cigarettes  . Smokeless tobacco: Never Used  . Alcohol use Yes     Comment: wine/beer socially     Allergies   Patient has no known allergies.   Review of Systems Review of Systems  Constitutional: Negative for appetite change, chills and fever.  HENT: Negative for ear pain, rhinorrhea, sneezing and sore throat.   Eyes: Negative for photophobia and visual disturbance.    Respiratory: Negative for cough, chest tightness, shortness of breath and wheezing.   Cardiovascular: Positive for chest pain. Negative for palpitations.  Gastrointestinal: Positive for abdominal pain. Negative for blood in stool, constipation, diarrhea, nausea and vomiting.  Genitourinary: Negative for dysuria, hematuria and urgency.  Musculoskeletal: Negative for myalgias.  Skin: Negative for rash.  Neurological: Negative for dizziness, syncope, weakness, light-headedness, numbness and headaches.     Physical Exam Updated Vital Signs BP 120/80 (BP Location: Right Arm)   Pulse 60   Temp 97.7 F (36.5 C) (Oral)   Resp 16   Ht 5\' 6"  (1.676 m)   Wt 84.8 kg (187 lb)   LMP 03/28/2014 (Approximate)   SpO2 100%   BMI 30.18 kg/m   Physical Exam  Constitutional: She is oriented to person, place, and time. She appears well-developed and well-nourished. No distress.  HENT:  Head: Normocephalic and atraumatic.  Nose: Nose normal.  Eyes: Conjunctivae and EOM are normal. Right eye exhibits no discharge. Left eye exhibits no discharge. No scleral icterus.  Neck: Normal range of motion. Neck supple.  Cardiovascular: Normal rate, regular rhythm, normal heart sounds and intact distal pulses.  Exam reveals no gallop and no friction rub.   No murmur heard. Pulmonary/Chest: Effort normal and breath sounds normal. No respiratory distress. She exhibits tenderness (Left-sided).  Abdominal: Soft. Bowel sounds are normal. She exhibits no distension. There is tenderness (Epigastric). There is no guarding.  Musculoskeletal: Normal range of motion. She exhibits no edema.  Neurological: She is alert and oriented to person, place, and time. No cranial nerve deficit or sensory deficit. She exhibits normal muscle tone. Coordination normal.  Skin: Skin is warm and dry. No rash noted.  Psychiatric: She has a normal mood and affect.  Nursing note and vitals reviewed.    ED Treatments / Results  Labs (all  labs ordered are listed, but only abnormal results are displayed) Labs Reviewed  BASIC METABOLIC PANEL - Abnormal; Notable for the following:       Result Value   Glucose, Bld 115 (*)    All other components within normal limits  CBC  LIPASE, BLOOD  I-STAT TROPOININ, ED  POCT I-STAT TROPONIN I  I-STAT TROPOININ, ED  POCT I-STAT TROPONIN I    EKG  EKG Interpretation  Date/Time:  Monday Nov 22 2016 14:43:36 EDT Ventricular Rate:  79 PR Interval:    QRS Duration: 85 QT Interval:  404 QTC Calculation: 464 R Axis:   8 Text Interpretation:  Sinus rhythm Probable left atrial enlargement Probable anteroseptal infarct, old Baseline wander in lead(s) V5 since last tracing no significant change Confirmed by Daleen Bo [phone]) on 11/22/2016 9:20:42 PM       Radiology Dg Chest 2 View  Result Date: 11/22/2016 CLINICAL DATA:  Intermittent left-sided chest pain EXAM: CHEST  2 VIEW COMPARISON:  Chest CT 11/18/2005, CXR 11/14/2005 FINDINGS: The heart size and mediastinal contours are within normal limits. Both  lungs are clear. Degenerative changes are seen about the dorsal spine and both glenohumeral joints. Cholecystectomy clips are noted in the right upper quadrant. IMPRESSION: No active cardiopulmonary disease. Electronically Signed   By: Ashley Royalty M.D.   On: 11/22/2016 15:13    Procedures Procedures (including critical care time)  Medications Ordered in ED Medications  ketorolac (TORADOL) 30 MG/ML injection 15 mg (15 mg Intramuscular Given 11/22/16 2045)     Initial Impression / Assessment and Plan / ED Course  I have reviewed the triage vital signs and the nursing notes.  Pertinent labs & imaging results that were available during my care of the patient were reviewed by me and considered in my medical decision making (see chart for details).     Patient's history and symptoms concerning for ACS versus musculoskeletal chest pain versus PE. Patient has a history of  hypertension, hyperlipidemia and diabetes. Patient is PERC negative at this time. No further workup for DVT or PE is needed. She states that her pain is intermittent and reproducible with palpation. No prior cardiac history. No cough, fever lungs are clear to auscultation bilaterally. No concern for pneumonia. No deficits found on neurological exam. Troponin negative 2, including delta troponin. BMP, CBC, lipase unremarkable at this time. EKG showed no acute changes from baseline. Chest x-ray negative for any acute cardiopulmonary disease. Patient's HEART and HEAR score is 3. Patient is low risk for any adverse cardiac event, due to the fact that her pain is intermittent, not caused by anything in particular, reproducible with palpation and somewhat relieved with Toradol. I reassured her of her negative workup today but advised her to follow up with her PCP for further evaluation.  No further imaging or lab work indicated at this time. Take ibuprofen or Tylenol as needed for the pain. Strict return precautions given.  Final Clinical Impressions(s) / ED Diagnoses   Final diagnoses:  Chest wall pain    New Prescriptions Discharge Medication List as of 11/22/2016  9:28 PM       Delia Heady, PA-C 11/22/16 2259    Daleen Bo, MD 11/23/16 1335

## 2016-11-22 NOTE — Discharge Instructions (Signed)
Take ibuprofen or Tylenol as needed for pain. Follow-up with PCP for further evaluation. Return to ED for worsening pain, trouble breathing, coughing up blood, leg swelling, vision changes, injury.

## 2016-11-22 NOTE — ED Triage Notes (Signed)
Pt complaint of intermittent left chest sharpness worsening over past week with associated left shoulder, left hand tingling, and flushed.

## 2017-04-12 ENCOUNTER — Encounter (HOSPITAL_COMMUNITY): Payer: Self-pay | Admitting: Emergency Medicine

## 2017-04-12 ENCOUNTER — Emergency Department (HOSPITAL_COMMUNITY)
Admission: EM | Admit: 2017-04-12 | Discharge: 2017-04-13 | Disposition: A | Discharge status: Home / Self Care | Payer: BLUE CROSS/BLUE SHIELD | Attending: Emergency Medicine | Admitting: Emergency Medicine

## 2017-04-12 DIAGNOSIS — F17210 Nicotine dependence, cigarettes, uncomplicated: Secondary | ICD-10-CM | POA: Insufficient documentation

## 2017-04-12 DIAGNOSIS — Z7984 Long term (current) use of oral hypoglycemic drugs: Secondary | ICD-10-CM | POA: Insufficient documentation

## 2017-04-12 DIAGNOSIS — R51 Headache: Secondary | ICD-10-CM | POA: Insufficient documentation

## 2017-04-12 DIAGNOSIS — Z79899 Other long term (current) drug therapy: Secondary | ICD-10-CM | POA: Diagnosis not present

## 2017-04-12 DIAGNOSIS — E119 Type 2 diabetes mellitus without complications: Secondary | ICD-10-CM | POA: Diagnosis not present

## 2017-04-12 DIAGNOSIS — R42 Dizziness and giddiness: Secondary | ICD-10-CM

## 2017-04-12 LAB — URINALYSIS, ROUTINE W REFLEX MICROSCOPIC
Bilirubin Urine: NEGATIVE
GLUCOSE, UA: NEGATIVE mg/dL
HGB URINE DIPSTICK: NEGATIVE
Ketones, ur: NEGATIVE mg/dL
Leukocytes, UA: NEGATIVE
Nitrite: NEGATIVE
Protein, ur: NEGATIVE mg/dL
SPECIFIC GRAVITY, URINE: 1.02 (ref 1.005–1.030)
pH: 5 (ref 5.0–8.0)

## 2017-04-12 LAB — BASIC METABOLIC PANEL
Anion gap: 13 (ref 5–15)
BUN: 11 mg/dL (ref 6–20)
CHLORIDE: 104 mmol/L (ref 101–111)
CO2: 24 mmol/L (ref 22–32)
CREATININE: 0.7 mg/dL (ref 0.44–1.00)
Calcium: 9.6 mg/dL (ref 8.9–10.3)
GFR calc non Af Amer: >60 mL/min (ref >60)
Glucose, Bld: 97 mg/dL (ref 65–99)
POTASSIUM: 3.8 mmol/L (ref 3.5–5.1)
SODIUM: 141 mmol/L (ref 135–145)

## 2017-04-12 LAB — CBG MONITORING, ED: Glucose-Capillary: 93 mg/dL (ref 65–99)

## 2017-04-12 LAB — CBC
HCT: 41.3 % (ref 36.0–46.0)
Hemoglobin: 14.1 g/dL (ref 12.0–15.0)
MCH: 30.4 pg (ref 26.0–34.0)
MCHC: 34.1 g/dL (ref 30.0–36.0)
MCV: 89 fL (ref 78.0–100.0)
PLATELETS: 415 10*3/uL — AB (ref 150–400)
RBC: 4.64 MIL/uL (ref 3.87–5.11)
RDW: 15.7 % — ABNORMAL HIGH (ref 11.5–15.5)
WBC: 15.6 10*3/uL — AB (ref 4.0–10.5)

## 2017-04-12 NOTE — ED Triage Notes (Signed)
Per EMS-states she has a history of vertigo and had an episode while she was in her kitchen-states she hasn't taken her BP meds in a couple of days because she forgot-doesn't know if she "passed out"-placed in C-collar-complaining of a headache

## 2017-04-13 ENCOUNTER — Emergency Department (HOSPITAL_COMMUNITY): Payer: BLUE CROSS/BLUE SHIELD

## 2017-04-13 MED ORDER — MECLIZINE HCL 25 MG PO TABS
50.0000 mg | ORAL_TABLET | Freq: Once | ORAL | Status: AC
  Administered 2017-04-13: 50 mg via ORAL
  Filled 2017-04-13: qty 2

## 2017-04-13 MED ORDER — MECLIZINE HCL 25 MG PO TABS: 25.0000 mg | ORAL_TABLET | Freq: Three times a day (TID) | ORAL | 0 refills | Status: DC | PRN

## 2017-04-13 NOTE — ED Notes (Signed)
Attempted to ambulate pt  Pt's gait very unsteady  Unable to ambulate on her own

## 2017-04-13 NOTE — ED Notes (Signed)
Returned from CT.

## 2017-04-13 NOTE — ED Notes (Signed)
Patient transported to CT 

## 2017-04-13 NOTE — ED Provider Notes (Signed)
[facility] DEPT Provider Note   CSN: [id number] Arrival date & time: 04/12/17  1613     History   Chief Complaint Chief Complaint  Patient presents with  . Headache  . Dizziness    HPI Amanda Walker is a 51 y.o. female.  Patient with history of T2DM, HTN presents with dizziness, worse with position, causing fall today while walking to the kitchen. She hit her head on the dryer and experienced a period of confusion prior to returning to her baseline, per daughter. The patient reports a history of vertigo with similar symptoms. No nausea, vomiting, visual changes. She denies chest pain, SOB, lateralizing weakness, aphasia or visual changes.    The history is provided by the patient. No language interpreter was used.  Headache   Pertinent negatives include no fever, no shortness of breath, no nausea and no vomiting.  Dizziness  Associated symptoms: headaches and tinnitus   Associated symptoms: no chest pain, no nausea, no shortness of breath, no vomiting and no weakness     Past Medical History:  Diagnosis Date  . Diabetes mellitus without complication ([place])    type 2  . Sleep apnea    does not use cpap  . SVD (spontaneous vaginal delivery)    x 3    There are no active problems to display for this patient.   Past Surgical History:  Procedure Laterality Date  . CHOLECYSTECTOMY    . DILATATION & CURRETTAGE/HYSTEROSCOPY WITH RESECTOCOPE N/A 01/17/2014   Procedure: Diagnostic HYSTEROSCOPY,  Hysteroscopic resection of fibroid (D&C);  Surgeon: Marvene Staff, MD;  Location: [facility];  Service: Gynecology;  Laterality: N/A;  . TOOTH EXTRACTION    . TUBAL LIGATION    . WISDOM TOOTH EXTRACTION      OB History    No data available       Home Medications    Prior to Admission medications   Medication Sig Start Date End Date Taking? Authorizing Provider  aspirin EC 81 MG tablet Take 81 mg by mouth daily.    [provider]  cyclobenzaprine (FLEXERIL) 5 MG tablet Take 1 tablet (5 mg total) by mouth 3 (three) times daily as needed for muscle spasms. Patient not taking: Reported on 11/22/2016 06/30/15   Margarita Mail, PA-C  diazepam (VALIUM) 2 MG tablet Take 1 tablet (2 mg total) by mouth every 6 (six) hours as needed (dizziness). Patient not taking: Reported on 11/22/2016 06/30/15   Margarita Mail, PA-C  ibuprofen (ADVIL,MOTRIN) 800 MG tablet Take 1 tablet (800 mg total) by mouth every 8 (eight) hours as needed. Patient not taking: Reported on 06/30/2015 01/17/14   Servando Salina, MD  lisinopril-hydrochlorothiazide (PRINZIDE,ZESTORETIC) 20-12.5 MG tablet Take 1 tablet by mouth daily. 10/04/16   [provider]  meclizine (ANTIVERT) 25 MG tablet Take 1 tablet (25 mg total) by mouth 3 (three) times daily as needed for dizziness. Patient not taking: Reported on 11/22/2016 06/30/15   Margarita Mail, PA-C  metFORMIN (GLUCOPHAGE) 500 MG tablet Take 500 mg by mouth daily with breakfast.    [provider]  omeprazole (PRILOSEC) 20 MG capsule Take 20 mg by mouth 2 (two) times daily. 10/12/16   [provider]  pravastatin (PRAVACHOL) 20 MG tablet Take 20 mg by mouth daily. 10/22/16   [provider]  traMADol (ULTRAM) 50 MG tablet Take 1 tablet (50 mg total) by mouth every 6 (six) hours as needed. Patient not taking: Reported on 11/22/2016 06/30/15  Margarita Mail, PA-C    Family History No family history on file.  Social History Social History  Substance Use Topics  . Smoking status: Current Every Day Smoker    Packs/day: 0.50    Years: 15.00    Types: Cigarettes  . Smokeless tobacco: Never Used  . Alcohol use Yes     Comment: wine/beer socially     Allergies   Patient has no known allergies.   Review of Systems Review of Systems  Constitutional: Negative for chills and fever.  HENT: Positive for tinnitus.   Eyes: Negative for visual disturbance.  Respiratory: Negative.   Negative for shortness of breath.   Cardiovascular: Negative.  Negative for chest pain.  Gastrointestinal: Negative.  Negative for nausea and vomiting.  Musculoskeletal: Negative.   Skin: Negative.   Neurological: Positive for dizziness and headaches. Negative for weakness.  Psychiatric/Behavioral: Positive for confusion.     Physical Exam Updated Vital Signs BP 122/80 (BP Location: Right Arm)   Pulse 84   Temp 98.7 F (37.1 C) (Oral)   Resp 16   Ht 5\' 5"  (1.651 m)   Wt 77.1 kg (170 lb)   LMP 03/28/2014 (Approximate)   SpO2 97%   BMI 28.29 kg/m   Physical Exam  Constitutional: She is oriented to person, place, and time. She appears well-developed and well-nourished.  HENT:  Head: Normocephalic.  Eyes: Pupils are equal, round, and reactive to light.  Neck: Normal range of motion. Neck supple.  Cardiovascular: Normal rate and regular rhythm.   Pulmonary/Chest: Effort normal and breath sounds normal. She has no wheezes. She has no rales.  Abdominal: Soft. Bowel sounds are normal. There is no tenderness. There is no rebound and no guarding.  Musculoskeletal: Normal range of motion.  Neurological: She is alert and oriented to person, place, and time. No sensory deficit. She exhibits normal muscle tone. Coordination normal.  CN's 3-12 grossly intact. Speech is clear and focused. No facial asymmetry. No lateralizing weakness.  No deficits of coordination. Ambulatory without imbalance.    Skin: Skin is warm and dry. No rash noted.  Psychiatric: She has a normal mood and affect.     ED Treatments / Results  Labs (all labs ordered are listed, but only abnormal results are displayed) Labs Reviewed  CBC - Abnormal; Notable for the following:       Result Value   WBC 15.6 (*)    RDW 15.7 (*)    Platelets 415 (*)    All other components within normal limits  BASIC METABOLIC PANEL  URINALYSIS, ROUTINE W REFLEX MICROSCOPIC  CBG MONITORING, ED   Results for orders placed or  performed during the hospital encounter of 42/68/34  Basic metabolic panel  Result Value Ref Range   Sodium 141 135 - 145 mmol/L   Potassium 3.8 3.5 - 5.1 mmol/L   Chloride 104 101 - 111 mmol/L   CO2 24 22 - 32 mmol/L   Glucose, Bld 97 65 - 99 mg/dL   BUN 11 6 - 20 mg/dL   Creatinine, Ser 0.70 0.44 - 1.00 mg/dL   Calcium 9.6 8.9 - 10.3 mg/dL   GFR calc non Af Amer >60 >60 mL/min   GFR calc Af Amer >60 >60 mL/min   Anion gap 13 5 - 15  CBC  Result Value Ref Range   WBC 15.6 (H) 4.0 - 10.5 K/uL   RBC 4.64 3.87 - 5.11 MIL/uL   Hemoglobin 14.1 12.0 - 15.0 g/dL   HCT  41.3 36.0 - 46.0 %   MCV 89.0 78.0 - 100.0 fL   MCH 30.4 26.0 - 34.0 pg   MCHC 34.1 30.0 - 36.0 g/dL   RDW 15.7 (H) 11.5 - 15.5 %   Platelets 415 (H) 150 - 400 K/uL  Urinalysis, Routine w reflex microscopic  Result Value Ref Range   Color, Urine YELLOW YELLOW   APPearance CLEAR CLEAR   Specific Gravity, Urine 1.020 1.005 - 1.030   pH 5.0 5.0 - 8.0   Glucose, UA NEGATIVE NEGATIVE mg/dL   Hgb urine dipstick NEGATIVE NEGATIVE   Bilirubin Urine NEGATIVE NEGATIVE   Ketones, ur NEGATIVE NEGATIVE mg/dL   Protein, ur NEGATIVE NEGATIVE mg/dL   Nitrite NEGATIVE NEGATIVE   Leukocytes, UA NEGATIVE NEGATIVE  CBG monitoring, ED  Result Value Ref Range   Glucose-Capillary 93 65 - 99 mg/dL     EKG  EKG Interpretation None       Radiology No results found.  Procedures Procedures (including critical care time)  Medications Ordered in ED Medications  meclizine (ANTIVERT) tablet 50 mg (not administered)     Initial Impression / Assessment and Plan / ED Course  I have reviewed the triage vital signs and the nursing notes.  Pertinent labs & imaging results that were available during my care of the patient were reviewed by me and considered in my medical decision making (see chart for details).     Patient presents with "room-spinning" dizziness similar to previous episode of vertigo. She fell while walking and  hit her head during the fall. No full syncope. Head CT unremarkable. VSS.  Meclizine given for symptoms with improvement in dizziness. She is ambulatory and reports dizziness is better than earlier with ambulation.   She is felt stable for discharge home. Strongly encouraged to see her doctor in 2-3 days for recheck.   Final Clinical Impressions(s) / ED Diagnoses   Final diagnoses:  None   1. Peripheral vertigo  New Prescriptions New Prescriptions   No medications on file     Dennie Bible 04/13/17 [id number]    Palumbo, April, MD 04/13/17 0236

## 2017-04-13 NOTE — Discharge Instructions (Signed)
Take Meclizine as prescribed. Follow up with your doctor for recheck this week to insure you are getting better. Return to the emergency department if symptoms worsen.

## 2018-05-08 ENCOUNTER — Encounter (HOSPITAL_COMMUNITY): Payer: Self-pay | Admitting: Obstetrics and Gynecology

## 2018-05-08 ENCOUNTER — Emergency Department (HOSPITAL_COMMUNITY): Payer: BLUE CROSS/BLUE SHIELD

## 2018-05-08 ENCOUNTER — Emergency Department (HOSPITAL_COMMUNITY)
Admission: EM | Admit: 2018-05-08 | Discharge: 2018-05-08 | Disposition: A | Discharge status: Home / Self Care | Payer: BLUE CROSS/BLUE SHIELD | Attending: Emergency Medicine | Admitting: Emergency Medicine

## 2018-05-08 ENCOUNTER — Other Ambulatory Visit: Payer: Self-pay

## 2018-05-08 DIAGNOSIS — E119 Type 2 diabetes mellitus without complications: Secondary | ICD-10-CM | POA: Diagnosis not present

## 2018-05-08 DIAGNOSIS — Z79899 Other long term (current) drug therapy: Secondary | ICD-10-CM | POA: Diagnosis not present

## 2018-05-08 DIAGNOSIS — R079 Chest pain, unspecified: Secondary | ICD-10-CM | POA: Diagnosis present

## 2018-05-08 DIAGNOSIS — Z7984 Long term (current) use of oral hypoglycemic drugs: Secondary | ICD-10-CM | POA: Diagnosis not present

## 2018-05-08 DIAGNOSIS — Z7982 Long term (current) use of aspirin: Secondary | ICD-10-CM | POA: Diagnosis not present

## 2018-05-08 DIAGNOSIS — I10 Essential (primary) hypertension: Secondary | ICD-10-CM | POA: Insufficient documentation

## 2018-05-08 DIAGNOSIS — R1013 Epigastric pain: Secondary | ICD-10-CM

## 2018-05-08 DIAGNOSIS — F17210 Nicotine dependence, cigarettes, uncomplicated: Secondary | ICD-10-CM | POA: Insufficient documentation

## 2018-05-08 DIAGNOSIS — K2900 Acute gastritis without bleeding: Secondary | ICD-10-CM

## 2018-05-08 DIAGNOSIS — Z9049 Acquired absence of other specified parts of digestive tract: Secondary | ICD-10-CM | POA: Insufficient documentation

## 2018-05-08 DIAGNOSIS — K2960 Other gastritis without bleeding: Secondary | ICD-10-CM | POA: Diagnosis not present

## 2018-05-08 HISTORY — DX: Essential (primary) hypertension: I10

## 2018-05-08 LAB — BASIC METABOLIC PANEL
ANION GAP: 9 (ref 5–15)
BUN: 9 mg/dL (ref 6–20)
CALCIUM: 9.2 mg/dL (ref 8.9–10.3)
CO2: 25 mmol/L (ref 22–32)
CREATININE: 0.75 mg/dL (ref 0.44–1.00)
Chloride: 104 mmol/L (ref 98–111)
GLUCOSE: 165 mg/dL — AB (ref 70–99)
Potassium: 3.4 mmol/L — ABNORMAL LOW (ref 3.5–5.1)
Sodium: 138 mmol/L (ref 135–145)

## 2018-05-08 LAB — HEPATIC FUNCTION PANEL
ALBUMIN: 3.9 g/dL (ref 3.5–5.0)
ALT: 20 U/L (ref 0–44)
AST: 25 U/L (ref 15–41)
Alkaline Phosphatase: 68 U/L (ref 38–126)
Bilirubin, Direct: 0.1 mg/dL (ref 0.0–0.2)
Indirect Bilirubin: 0.4 mg/dL (ref 0.3–0.9)
Total Bilirubin: 0.5 mg/dL (ref 0.3–1.2)
Total Protein: 7.6 g/dL (ref 6.5–8.1)

## 2018-05-08 LAB — CBC
HCT: 42.4 % (ref 36.0–46.0)
Hemoglobin: 13.4 g/dL (ref 12.0–15.0)
MCH: 29.6 pg (ref 26.0–34.0)
MCHC: 31.6 g/dL (ref 30.0–36.0)
MCV: 93.8 fL (ref 80.0–100.0)
NRBC: 0 % (ref 0.0–0.2)
PLATELETS: 405 10*3/uL — AB (ref 150–400)
RBC: 4.52 MIL/uL (ref 3.87–5.11)
RDW: 14.9 % (ref 11.5–15.5)
WBC: 10.6 10*3/uL — ABNORMAL HIGH (ref 4.0–10.5)

## 2018-05-08 LAB — I-STAT TROPONIN, ED
TROPONIN I, POC: 0 ng/mL (ref 0.00–0.08)
TROPONIN I, POC: 0 ng/mL (ref 0.00–0.08)

## 2018-05-08 LAB — LIPASE, BLOOD: Lipase: 61 U/L — ABNORMAL HIGH (ref 11–51)

## 2018-05-08 MED ORDER — OMEPRAZOLE 20 MG PO CPDR: 40.0000 mg | DELAYED_RELEASE_CAPSULE | Freq: Every day | ORAL | 0 refills | Status: AC

## 2018-05-08 NOTE — ED Provider Notes (Signed)
[facility] DEPT Provider Note   CSN: [id number] Arrival date & time: 05/08/18  0310     History   Chief Complaint Chief Complaint  Patient presents with  . Chest Pain    HPI Amanda Walker is a 52 y.o. female.  HPI   Presents with concern for chest pain, begins in epigastrium and radiates upwards Feels like someone pressing down in the chest with radiation to the left shoulder, pressure Began at 2AM, woke from sleep Not exertional, better standing up Nausea Epigastric pain Had eaten krispy kreme donuts before bed Felt similar to an episode she had after her cholecystectomy No dyspnea, diaphoresis No lightheadedness No cough or fever No hx of cardiac work up  Htn,DM No known family hx of CAD Smoking cigarettes, etoh 3-4 beers 3-4 days per week, no other drugs     Past Medical History:  Diagnosis Date  . Diabetes mellitus without complication ([place])    type 2  . Hypertension   . Sleep apnea    does not use cpap  . SVD (spontaneous vaginal delivery)    x 3    There are no active problems to display for this patient.   Past Surgical History:  Procedure Laterality Date  . CHOLECYSTECTOMY    . DILATATION & CURRETTAGE/HYSTEROSCOPY WITH RESECTOCOPE N/A 01/17/2014   Procedure: Diagnostic HYSTEROSCOPY,  Hysteroscopic resection of fibroid (D&C);  Surgeon: Marvene Staff, MD;  Location: [facility];  Service: Gynecology;  Laterality: N/A;  . TOOTH EXTRACTION    . TUBAL LIGATION    . WISDOM TOOTH EXTRACTION       OB History    Gravida      Para      Term      Preterm      AB      Living  3     SAB      TAB      Ectopic      Multiple      Live Births               Home Medications    Prior to Admission medications   Medication Sig Start Date End Date Taking? Authorizing Provider  aspirin EC 81 MG tablet Take 81 mg by mouth daily.   Yes [provider]  hydrochlorothiazide (MICROZIDE) 12.5 MG  capsule Take 12.5 mg by mouth daily.   Yes [provider]  hydrocortisone cream 1 % Apply 1 application topically 2 (two) times daily as needed for itching.   Yes [provider]  lisinopril (PRINIVIL,ZESTRIL) 40 MG tablet Take 40 mg by mouth daily.   Yes [provider]  metFORMIN (GLUCOPHAGE) 500 MG tablet Take 500 mg by mouth daily with breakfast.   Yes [provider]  pravastatin (PRAVACHOL) 20 MG tablet Take 20 mg by mouth daily. 10/22/16  Yes [provider]  cyclobenzaprine (FLEXERIL) 5 MG tablet Take 1 tablet (5 mg total) by mouth 3 (three) times daily as needed for muscle spasms. Patient not taking: Reported on 11/22/2016 06/30/15   Margarita Mail, PA-C  diazepam (VALIUM) 2 MG tablet Take 1 tablet (2 mg total) by mouth every 6 (six) hours as needed (dizziness). Patient not taking: Reported on 11/22/2016 06/30/15   Margarita Mail, PA-C  ibuprofen (ADVIL,MOTRIN) 800 MG tablet Take 1 tablet (800 mg total) by mouth every 8 (eight) hours as needed. Patient not taking: Reported on 06/30/2015 01/17/14   Servando Salina, MD  meclizine (  ANTIVERT) 25 MG tablet Take 1 tablet (25 mg total) by mouth 3 (three) times daily as needed for dizziness. Patient not taking: Reported on 05/08/2018 04/13/17   Charlann Lange, PA-C  omeprazole (PRILOSEC) 20 MG capsule Take 2 capsules (40 mg total) by mouth daily for 14 days. 05/08/18 05/22/18  Gareth Morgan, MD  traMADol (ULTRAM) 50 MG tablet Take 1 tablet (50 mg total) by mouth every 6 (six) hours as needed. Patient not taking: Reported on 11/22/2016 06/30/15   Margarita Mail, PA-C    Family History No family history on file.  Social History Social History   Tobacco Use  . Smoking status: Current Every Day Smoker    Packs/day: 0.50    Years: 15.00    Pack years: 7.50    Types: Cigarettes  . Smokeless tobacco: Never Used  Substance Use Topics  . Alcohol use: Yes    Comment: wine/beer socially  . Drug use:  No     Allergies   Patient has no known allergies.   Review of Systems Review of Systems  Constitutional: Negative for fever.  HENT: Negative for sore throat.   Eyes: Negative for visual disturbance.  Respiratory: Negative for cough and shortness of breath.   Cardiovascular: Positive for chest pain.  Gastrointestinal: Positive for nausea. Negative for abdominal pain, diarrhea and vomiting.  Genitourinary: Negative for difficulty urinating.  Musculoskeletal: Negative for back pain and neck pain.  Skin: Negative for rash.  Neurological: Negative for syncope and headaches.     Physical Exam Updated Vital Signs BP 117/82   Pulse 81   Temp 97.8 F (36.6 C) (Oral)   Resp 15   LMP 03/28/2014 (Approximate)   SpO2 97%   Physical Exam  Constitutional: She is oriented to person, place, and time. She appears well-developed and well-nourished. No distress.  HENT:  Head: Normocephalic and atraumatic.  Eyes: Conjunctivae and EOM are normal.  Neck: Normal range of motion.  Cardiovascular: Normal rate, regular rhythm, normal heart sounds and intact distal pulses. Exam reveals no gallop and no friction rub.  No murmur heard. Pulmonary/Chest: Effort normal and breath sounds normal. No respiratory distress. She has no wheezes. She has no rales.  Abdominal: Soft. She exhibits no distension. There is tenderness (epigastrum). There is no guarding.  Musculoskeletal: She exhibits no edema or tenderness.  Neurological: She is alert and oriented to person, place, and time.  Skin: Skin is warm and dry. No rash noted. She is not diaphoretic. No erythema.  Nursing note and vitals reviewed.    ED Treatments / Results  Labs (all labs ordered are listed, but only abnormal results are displayed) Labs Reviewed  BASIC METABOLIC PANEL - Abnormal; Notable for the following components:      Result Value   Potassium 3.4 (*)    Glucose, Bld 165 (*)    All other components within normal limits  CBC  - Abnormal; Notable for the following components:   WBC 10.6 (*)    Platelets 405 (*)    All other components within normal limits  LIPASE, BLOOD - Abnormal; Notable for the following components:   Lipase 61 (*)    All other components within normal limits  HEPATIC FUNCTION PANEL  I-STAT TROPONIN, ED  I-STAT TROPONIN, ED    EKG EKG Interpretation  Date/Time:  Monday May 08 2018 03:17:54 EST Ventricular Rate:  86 PR Interval:    QRS Duration: 91 QT Interval:  378 QTC Calculation: 453 R Axis:   25 Text  Interpretation:  Sinus rhythm No significant change since last tracing Confirmed by Gareth Morgan [phone]) on 05/08/2018 4:40:19 AM Also confirmed by Gareth Morgan [phone]), editor Radene Gunning [phone])  on 05/08/2018 7:16:20 AM   Radiology Dg Chest 2 View  Result Date: 05/08/2018 CLINICAL DATA:  Acute onset of epigastric abdominal pain, radiating to the left side of the chest and left shoulder. EXAM: CHEST - 2 VIEW COMPARISON:  Chest radiograph performed 11/22/2016 FINDINGS: The lungs are well-aerated and clear. There is no evidence of focal opacification, pleural effusion or pneumothorax. The heart is normal in size; the mediastinal contour is within normal limits. No acute osseous abnormalities are seen. Clips are noted within the right upper quadrant, reflecting prior cholecystectomy. IMPRESSION: No acute cardiopulmonary process seen. Electronically Signed   By: Garald Balding M.D.   On: 05/08/2018 04:00    Procedures Procedures (including critical care time)  Medications Ordered in ED Medications - No data to display   Initial Impression / Assessment and Plan / ED Course  I have reviewed the triage vital signs and the nursing notes.  Pertinent labs & imaging results that were available during my care of the patient were reviewed by me and considered in my medical decision making (see chart for details).     52yo female with history of htn, DM presents with  concern for epigastric and chest pain.  Differential diagnosis for chest pain includes pulmonary embolus, dissection, pneumothorax, pneumonia, ACS, myocarditis, pericarditis.  EKG was done and evaluate by me and showed no acute ST changes and no signs of pericarditis. Chest x-ray was done and evaluated by me and radiology and showed no sign of pneumonia or pneumothorax. Patient is without dyspnea and low risk [place] and have low suspicion for PE. Delta troponins are both negative, doubt ACS.  No sign of pancreatitis, hepatitis, cholangitis.  Given epigastric symptoms radiating to chest, epigastric tenderness, no exertional component suspect symptoms more likely GERD than anginal equivalent.  She does have risk factors for CAD and recommend close follow up within the next week and discussed return precautions in detail. Rec increasing omeprazole for next 2 weeks.  Counseled in smoking cessation. Patient discharged in stable condition with understanding of reasons to return.     Final Clinical Impressions(s) / ED Diagnoses   Final diagnoses:  Epigastric pain  Chest pain, unspecified type  Other acute gastritis without hemorrhage    ED Discharge Orders         Ordered    omeprazole (PRILOSEC) 20 MG capsule  Daily     05/08/18 0730           Gareth Morgan, MD 05/08/18 1929

## 2018-05-08 NOTE — ED Triage Notes (Signed)
Per Pt: Pt reports she ate 2 krispy kreme doughnuts before she went to bed last night, but she sat up for over 30 minutes to not irritate her GERD.  Pt reports pain woke her up around 0230 and she began having pain shooting from the left of her chest up to her left shoulder.  Pt denies radiation to jaw or arm

## 2019-04-18 ENCOUNTER — Other Ambulatory Visit: Payer: Self-pay

## 2019-04-18 ENCOUNTER — Emergency Department (HOSPITAL_COMMUNITY)
Admission: EM | Admit: 2019-04-18 | Discharge: 2019-04-18 | Disposition: A | Discharge status: Home / Self Care | Payer: Managed Care, Other (non HMO) | Attending: Emergency Medicine | Admitting: Emergency Medicine

## 2019-04-18 ENCOUNTER — Encounter (HOSPITAL_COMMUNITY): Payer: Self-pay | Admitting: Student

## 2019-04-18 DIAGNOSIS — I10 Essential (primary) hypertension: Secondary | ICD-10-CM | POA: Insufficient documentation

## 2019-04-18 DIAGNOSIS — Z79899 Other long term (current) drug therapy: Secondary | ICD-10-CM | POA: Insufficient documentation

## 2019-04-18 DIAGNOSIS — Z7984 Long term (current) use of oral hypoglycemic drugs: Secondary | ICD-10-CM | POA: Insufficient documentation

## 2019-04-18 DIAGNOSIS — R55 Syncope and collapse: Secondary | ICD-10-CM | POA: Insufficient documentation

## 2019-04-18 DIAGNOSIS — Z20828 Contact with and (suspected) exposure to other viral communicable diseases: Secondary | ICD-10-CM | POA: Diagnosis not present

## 2019-04-18 DIAGNOSIS — Z7982 Long term (current) use of aspirin: Secondary | ICD-10-CM | POA: Insufficient documentation

## 2019-04-18 DIAGNOSIS — E119 Type 2 diabetes mellitus without complications: Secondary | ICD-10-CM | POA: Diagnosis not present

## 2019-04-18 DIAGNOSIS — F17210 Nicotine dependence, cigarettes, uncomplicated: Secondary | ICD-10-CM | POA: Diagnosis not present

## 2019-04-18 LAB — CBC WITH DIFFERENTIAL/PLATELET
Abs Immature Granulocytes: 0.13 10*3/uL — ABNORMAL HIGH (ref 0.00–0.07)
Basophils Absolute: 0.1 10*3/uL (ref 0.0–0.1)
Basophils Relative: 1 %
Eosinophils Absolute: 0.1 10*3/uL (ref 0.0–0.5)
Eosinophils Relative: 0 %
HCT: 41.5 % (ref 36.0–46.0)
Hemoglobin: 13.5 g/dL (ref 12.0–15.0)
Immature Granulocytes: 1 %
Lymphocytes Relative: 28 %
Lymphs Abs: 3.3 10*3/uL (ref 0.7–4.0)
MCH: 29.5 pg (ref 26.0–34.0)
MCHC: 32.5 g/dL (ref 30.0–36.0)
MCV: 90.6 fL (ref 80.0–100.0)
Monocytes Absolute: 0.6 10*3/uL (ref 0.1–1.0)
Monocytes Relative: 5 %
Neutro Abs: 7.4 10*3/uL (ref 1.7–7.7)
Neutrophils Relative %: 65 %
Platelets: 435 10*3/uL — ABNORMAL HIGH (ref 150–400)
RBC: 4.58 MIL/uL (ref 3.87–5.11)
RDW: 15 % (ref 11.5–15.5)
WBC: 11.5 10*3/uL — ABNORMAL HIGH (ref 4.0–10.5)
nRBC: 0 % (ref 0.0–0.2)

## 2019-04-18 LAB — COMPREHENSIVE METABOLIC PANEL
ALT: 18 U/L (ref 0–44)
AST: 21 U/L (ref 15–41)
Albumin: 4.3 g/dL (ref 3.5–5.0)
Alkaline Phosphatase: 85 U/L (ref 38–126)
Anion gap: 13 (ref 5–15)
BUN: 12 mg/dL (ref 6–20)
CO2: 21 mmol/L — ABNORMAL LOW (ref 22–32)
Calcium: 9.7 mg/dL (ref 8.9–10.3)
Chloride: 103 mmol/L (ref 98–111)
Creatinine, Ser: 0.65 mg/dL (ref 0.44–1.00)
GFR calc Af Amer: >60 mL/min (ref >60)
GFR calc non Af Amer: >60 mL/min (ref >60)
Glucose, Bld: 168 mg/dL — ABNORMAL HIGH (ref 70–99)
Potassium: 3.9 mmol/L (ref 3.5–5.1)
Sodium: 137 mmol/L (ref 135–145)
Total Bilirubin: 0.7 mg/dL (ref 0.3–1.2)
Total Protein: 8.3 g/dL — ABNORMAL HIGH (ref 6.5–8.1)

## 2019-04-18 LAB — SARS CORONAVIRUS 2 BY RT PCR (HOSPITAL ORDER, PERFORMED IN ~~LOC~~ HOSPITAL LAB): SARS Coronavirus 2: NEGATIVE

## 2019-04-18 LAB — CBG MONITORING, ED: Glucose-Capillary: 163 mg/dL — ABNORMAL HIGH (ref 70–99)

## 2019-04-18 MED ORDER — SODIUM CHLORIDE 0.9 % IV BOLUS
1000.0000 mL | Freq: Once | INTRAVENOUS | Status: AC
  Administered 2019-04-18: 1000 mL via INTRAVENOUS

## 2019-04-18 MED ORDER — LORAZEPAM 2 MG/ML IJ SOLN
1.0000 mg | Freq: Once | INTRAMUSCULAR | Status: AC
  Administered 2019-04-18: 09:00:00 1 mg via INTRAVENOUS
  Filled 2019-04-18: qty 1

## 2019-04-18 MED ORDER — LORAZEPAM 1 MG PO TABS
1.0000 mg | ORAL_TABLET | Freq: Once | ORAL | Status: AC
  Administered 2019-04-18: 1 mg via ORAL
  Filled 2019-04-18: qty 1

## 2019-04-18 MED ORDER — LORAZEPAM 1 MG PO TABS: 1.0000 mg | ORAL_TABLET | Freq: Three times a day (TID) | ORAL | 0 refills | Status: AC

## 2019-04-18 NOTE — ED Triage Notes (Signed)
Patient came in via POV. Patient was on the way to hospital to see her daughter who was also a patient in ED but transported to ICU. Patient reportedly passed out in the car on the way to ED. Patient was unconscious for around 5 minutes per family.

## 2019-04-18 NOTE — Discharge Instructions (Addendum)
You were seen in the emergency department today after passing out. Your blood sugar was somewhat high, please have this rechecked by your primary care provider. We are sending him with prescription for Ativan to take every 8 hours as needed for anxiety.  This medication can make you sleepy, please not drive or operate heavy machinery when taking, please additionally do not take other sedating medicines such as narcotics or drink alcohol at this medicine.  We have prescribed you new medication(s) today. Discuss the medications prescribed today with your pharmacist as they can have adverse effects and interactions with your other medicines including over the counter and prescribed medications. Seek medical evaluation if you start to experience new or abnormal symptoms after taking one of these medicines, seek care immediately if you start to experience difficulty breathing, feeling of your throat closing, facial swelling, or rash as these could be indications of a more serious allergic reaction  Please follow-up with your primary care provider within 48 hours.  Return to the ER for new or worsening symptoms including but not limited to passing out again, chest pain, trouble breathing, seizure activity, thoughts of self-harm or hurting others, or any other concerns   We have tested you for coronavirus, we will call you if results are positive, if positive you will need to quarantine for 14 days.

## 2019-04-18 NOTE — ED Provider Notes (Signed)
[facility] DEPT Provider Note   CSN: [id number] Arrival date & time: 04/18/19  0845     History   Chief Complaint Chief Complaint  Patient presents with  . Loss of Consciousness    HPI Amanda Walker is a 53 y.o. female with a hx of tobacco abuse, DM, HTN, sleep apnea, & prior tubal ligation/cholecystectomy who presents to the ED s/p syncopal episode that occurred just PTA.  Majority of history is provided by patient's daughter as patient is very upset, crying, level 5 caveat applies secondary to patient's emotional distress. Per patient's family patient was seated belted in the passenger seat driving home from the hospital as she was here last night with her other daughter that is 26 years & is currently critically ill in the ICU. As they were on their way home they received a call that they should return as her daughter was worsening, she became very upset while speaking with her own mother on the phone and passed out prompting family to bring her to the ED. She was not verbally responsive for about 5 minutes. They did not note any seizure activity. She was not complaining of any chest pain or trouble breathing per family report. This has not happened before. On arrival patient calling out for her daughter whom is in the ICU.   HPI  Past Medical History:  Diagnosis Date  . Diabetes mellitus without complication ([place])    type 2  . Hypertension   . Sleep apnea    does not use cpap  . SVD (spontaneous vaginal delivery)    x 3    There are no active problems to display for this patient.   Past Surgical History:  Procedure Laterality Date  . CHOLECYSTECTOMY    . DILATATION & CURRETTAGE/HYSTEROSCOPY WITH RESECTOCOPE N/A 01/17/2014   Procedure: Diagnostic HYSTEROSCOPY,  Hysteroscopic resection of fibroid (D&C);  Surgeon: Marvene Staff, MD;  Location: [facility];  Service: Gynecology;  Laterality: N/A;  . TOOTH EXTRACTION    . TUBAL LIGATION     . WISDOM TOOTH EXTRACTION       OB History    Gravida      Para      Term      Preterm      AB      Living  3     SAB      TAB      Ectopic      Multiple      Live Births               Home Medications    Prior to Admission medications   Medication Sig Start Date End Date Taking? Authorizing Provider  aspirin EC 81 MG tablet Take 81 mg by mouth daily.    [provider]  cyclobenzaprine (FLEXERIL) 5 MG tablet Take 1 tablet (5 mg total) by mouth 3 (three) times daily as needed for muscle spasms. Patient not taking: Reported on 11/22/2016 06/30/15   Margarita Mail, PA-C  diazepam (VALIUM) 2 MG tablet Take 1 tablet (2 mg total) by mouth every 6 (six) hours as needed (dizziness). Patient not taking: Reported on 11/22/2016 06/30/15   Margarita Mail, PA-C  hydrochlorothiazide (MICROZIDE) 12.5 MG capsule Take 12.5 mg by mouth daily.    [provider]  hydrocortisone cream 1 % Apply 1 application topically 2 (two) times daily as needed for itching.    [provider]  ibuprofen (ADVIL,MOTRIN) 800 MG tablet  Take 1 tablet (800 mg total) by mouth every 8 (eight) hours as needed. Patient not taking: Reported on 06/30/2015 01/17/14   Servando Salina, MD  lisinopril (PRINIVIL,ZESTRIL) 40 MG tablet Take 40 mg by mouth daily.    [provider]  meclizine (ANTIVERT) 25 MG tablet Take 1 tablet (25 mg total) by mouth 3 (three) times daily as needed for dizziness. Patient not taking: Reported on 05/08/2018 04/13/17   Charlann Lange, PA-C  metFORMIN (GLUCOPHAGE) 500 MG tablet Take 500 mg by mouth daily with breakfast.    [provider]  omeprazole (PRILOSEC) 20 MG capsule Take 2 capsules (40 mg total) by mouth daily for 14 days. 05/08/18 05/22/18  Gareth Morgan, MD  pravastatin (PRAVACHOL) 20 MG tablet Take 20 mg by mouth daily. 10/22/16   [provider]  traMADol (ULTRAM) 50 MG tablet Take 1 tablet (50 mg total) by mouth every  6 (six) hours as needed. Patient not taking: Reported on 11/22/2016 06/30/15   Margarita Mail, PA-C    Family History History reviewed. No pertinent family history.  Social History Social History   Tobacco Use  . Smoking status: Current Every Day Smoker    Packs/day: 0.50    Years: 15.00    Pack years: 7.50    Types: Cigarettes  . Smokeless tobacco: Never Used  Substance Use Topics  . Alcohol use: Yes    Comment: wine/beer socially  . Drug use: No     Allergies   Patient has no known allergies.   Review of Systems Review of Systems  Reason unable to perform ROS: Emotional distress.    Physical Exam Updated Vital Signs BP (!) 110/93   Pulse (!) 104   Resp (!) 24   Ht 5\' 6"  (1.676 m)   Wt 78 kg   LMP 03/28/2014 (Approximate)   SpO2 100%   BMI 27.75 kg/m   Physical Exam Vitals signs and nursing note reviewed.  Constitutional:      Appearance: She is not toxic-appearing.  HENT:     Head: Normocephalic and atraumatic. No raccoon eyes or Battle's sign.  Eyes:     Pupils: Pupils are equal, round, and reactive to light.  Neck:     Musculoskeletal: Neck supple. No neck rigidity.  Cardiovascular:     Rate and Rhythm: Regular rhythm. Tachycardia present.  Pulmonary:     Effort: Tachypnea present. No accessory muscle usage.     Breath sounds: Normal breath sounds. No wheezing, rhonchi or rales.     Comments: Hyperventilating  Abdominal:     General: There is no distension.     Palpations: Abdomen is soft.     Tenderness: There is no abdominal tenderness.  Neurological:     Mental Status: She is alert.     Comments: Moving all extremities.  Clear speech, no aphasia/dysarthria.   Psychiatric:     Comments: Patient is tearful, moaning (not with any particular exam maneuver), frequently moving on stretcher, she is calling her daughter's name whom is in the ICU.       ED Treatments / Results  Labs (all labs ordered are listed, but only abnormal results are  displayed) Labs Reviewed  CBC WITH DIFFERENTIAL/PLATELET - Abnormal; Notable for the following components:      Result Value   WBC 11.5 (*)    Platelets 435 (*)    Abs Immature Granulocytes 0.13 (*)    All other components within normal limits  CBG MONITORING, ED - Abnormal; Notable for  the following components:   Glucose-Capillary 163 (*)    All other components within normal limits  SARS CORONAVIRUS 2 BY RT PCR (HOSPITAL ORDER, [facility])  COMPREHENSIVE METABOLIC PANEL    EKG EKG Interpretation  Date/Time:  Wednesday April 18 2019 08:56:51 EDT Ventricular Rate:  107 PR Interval:    QRS Duration: 85 QT Interval:  347 QTC Calculation: 463 R Axis:   21 Text Interpretation:  Sinus tachycardia Multiform ventricular premature complexes Aberrant conduction of SV complex(es) Probable left atrial enlargement Since last tracing rate faster Confirmed by Dorie Rank [phone]) on 04/18/2019 9:18:44 AM   Radiology No results found.  Procedures Procedures (including critical care time)  Medications Ordered in ED Medications  LORazepam (ATIVAN) injection 1 mg (1 mg Intravenous Given 04/18/19 0910)  sodium chloride 0.9 % bolus 1,000 mL (1,000 mLs Intravenous New Bag/Given 04/18/19 0911)    Initial Impression / Assessment and Plan / ED Course  I have reviewed the triage vital signs and the nursing notes.  Pertinent labs & imaging results that were available during my care of the patient were reviewed by me and considered in my medical decision making (see chart for details).   Patient presents to the ED s/p syncopal episode which occurred after receiving news about the worsening health status of her 42 year old daughter whom is in the ICU after admission last night. On arrival patient is tearful, moaning, crying out for her daughter, she does not answer my questions, she is moving all extremities, mildly tachycardic & tachypneic w/ hyperventilation- does not  appear to be in respiratory distress. Will obtain basic labs, EKG, & administer ativan.    Patient's daughter has passed away- patient was notified by prior ED provider team with chaplin & family @ bedside.  Spoke with hospital [facility] who is requesting rapid covid swab due to current circumstances & concern for possible covid in patient's daughter- test has been ordered.   EKG: Since last tracing faster rate- no other significant abnormalities.  CBC: Mild leukocytosis @ 11.5 felt to be nonspecific. Platelets mildly elevated similar to prior.  CMP: Hyperglycemia, bicarb mildly low, anion gap WNL. No significant electrolyte derangement.  COVID: Negative  Patient alert & oriented, she is requesting to be discharged so that she may be with daughter & family which I feel is appropriate. Will prescribe short course of PRN ativan (initially prescribed as TID, but instructions given to patient explain PRN TID). Patient & family in agreement.   Findings and plan of care discussed with supervising physician Dr. Tomi Bamberger who is in agreement.   Final Clinical Impressions(s) / ED Diagnoses   Final diagnoses:  Syncope, unspecified syncope type    ED Discharge Orders         Ordered    LORazepam (ATIVAN) 1 MG tablet  Every 8 hours     04/18/19 0954           Amaryllis Dyke, PA-C 04/18/19 1013    Dorie Rank, MD 04/19/19 [phone]

## 2019-04-18 NOTE — ED Notes (Signed)
BLOOD SUGAR 163

## 2019-04-18 NOTE — Progress Notes (Signed)
   04/18/19 1000  Clinical Encounter Type  Visited With Patient and family together  Visit Type Initial;Psychological support;Spiritual support;ED  Referral From Nurse  Consult/Referral To Chaplain  Spiritual Encounters  Spiritual Needs Prayer;Emotional;Other (Comment);Grief support (Spiritual Care Conversation/Support)  Stress Factors  Patient Stress Factors Loss  Family Stress Factors Loss   I visited with the patient per referral from the nurse due to the death of her daughter in our ICU.  I provided a comforting presence for the patient and her family who were present in the room. Grief support was provided.   Chaplain Shanon Ace M.Div., [facility]

## 2019-09-21 ENCOUNTER — Ambulatory Visit: Payer: Self-pay | Attending: Internal Medicine

## 2019-09-21 DIAGNOSIS — Z23 Encounter for immunization: Secondary | ICD-10-CM

## 2019-09-21 NOTE — Progress Notes (Signed)
   Covid-19 Vaccination Clinic  Name:  Amanda Walker    MRN: [id number] DOB: [birth year]  09/21/2019  Amanda Walker was observed post Covid-19 immunization for 15 minutes without incident. She was provided with Vaccine Information Sheet and instruction to access the V-Safe system.   Amanda Walker was instructed to call 911 with any severe reactions post vaccine: Marland Kitchen Difficulty breathing  . Swelling of face and throat  . A fast heartbeat  . A bad rash all over body  . Dizziness and weakness   Immunizations Administered    Name Date Dose VIS Date Route   Pfizer COVID-19 Vaccine 09/21/2019  4:18 PM 0.3 mL 06/08/2019 Intramuscular   Manufacturer: [place]   Lot: [id number]   [place]: [id number]

## 2019-10-17 ENCOUNTER — Ambulatory Visit: Payer: Self-pay | Attending: Internal Medicine

## 2019-10-17 DIAGNOSIS — Z23 Encounter for immunization: Secondary | ICD-10-CM

## 2019-10-17 NOTE — Progress Notes (Signed)
   Covid-19 Vaccination Clinic  Name:  Amanda Walker    MRN: [id number] DOB: 02-May-1966  10/17/2019  [name] was observed post Covid-19 immunization for 15 minutes without incident. She was provided with Vaccine Information Sheet and instruction to access the V-Safe system.   [name] was instructed to call 911 with any severe reactions post vaccine: Marland Kitchen Difficulty breathing  . Swelling of face and throat  . A fast heartbeat  . A bad rash all over body  . Dizziness and weakness   Immunizations Administered    Name Date Dose VIS Date Route   Pfizer COVID-19 Vaccine 10/17/2019 11:01 AM 0.3 mL 08/22/2018 Intramuscular   Manufacturer: [place]   Lot: [id number]   [place]: [id number]

## 2019-12-31 ENCOUNTER — Emergency Department (HOSPITAL_COMMUNITY)
Admission: EM | Admit: 2019-12-31 | Discharge: 2019-12-31 | Disposition: A | Discharge status: Home / Self Care | Payer: BC Managed Care – PPO | Attending: Emergency Medicine | Admitting: Emergency Medicine

## 2019-12-31 ENCOUNTER — Other Ambulatory Visit: Payer: Self-pay

## 2019-12-31 ENCOUNTER — Encounter (HOSPITAL_COMMUNITY): Payer: Self-pay | Admitting: Emergency Medicine

## 2019-12-31 DIAGNOSIS — I10 Essential (primary) hypertension: Secondary | ICD-10-CM | POA: Diagnosis not present

## 2019-12-31 DIAGNOSIS — Z7982 Long term (current) use of aspirin: Secondary | ICD-10-CM | POA: Insufficient documentation

## 2019-12-31 DIAGNOSIS — E119 Type 2 diabetes mellitus without complications: Secondary | ICD-10-CM | POA: Insufficient documentation

## 2019-12-31 DIAGNOSIS — W010XXA Fall on same level from slipping, tripping and stumbling without subsequent striking against object, initial encounter: Secondary | ICD-10-CM | POA: Insufficient documentation

## 2019-12-31 DIAGNOSIS — Y999 Unspecified external cause status: Secondary | ICD-10-CM | POA: Diagnosis not present

## 2019-12-31 DIAGNOSIS — Z7984 Long term (current) use of oral hypoglycemic drugs: Secondary | ICD-10-CM | POA: Diagnosis not present

## 2019-12-31 DIAGNOSIS — Y929 Unspecified place or not applicable: Secondary | ICD-10-CM | POA: Insufficient documentation

## 2019-12-31 DIAGNOSIS — S00501A Unspecified superficial injury of lip, initial encounter: Secondary | ICD-10-CM | POA: Diagnosis present

## 2019-12-31 DIAGNOSIS — F17210 Nicotine dependence, cigarettes, uncomplicated: Secondary | ICD-10-CM | POA: Diagnosis not present

## 2019-12-31 DIAGNOSIS — Z79899 Other long term (current) drug therapy: Secondary | ICD-10-CM | POA: Diagnosis not present

## 2019-12-31 DIAGNOSIS — S01511A Laceration without foreign body of lip, initial encounter: Secondary | ICD-10-CM | POA: Diagnosis not present

## 2019-12-31 DIAGNOSIS — Y939 Activity, unspecified: Secondary | ICD-10-CM | POA: Insufficient documentation

## 2019-12-31 MED ORDER — LIDOCAINE HCL (PF) 1 % IJ SOLN
30.0000 mL | Freq: Once | INTRAMUSCULAR | Status: AC
  Administered 2019-12-31: 30 mL via INTRADERMAL
  Filled 2019-12-31: qty 30
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
## 2019-12-31 NOTE — Discharge Instructions (Signed)
Follow-up with your primary care doctor in 7-10 days for suture removal.  Try to keep clean and dry until that time. I recommend soft diet for a few days until lip heals. Return here for new concerns.
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
## 2020-07-02 ENCOUNTER — Ambulatory Visit (HOSPITAL_COMMUNITY)
Admission: EM | Admit: 2020-07-02 | Discharge: 2020-07-02 | Disposition: A | Discharge status: Home / Self Care | Payer: BC Managed Care – PPO | Attending: Emergency Medicine | Admitting: Emergency Medicine

## 2020-07-02 ENCOUNTER — Ambulatory Visit (INDEPENDENT_AMBULATORY_CARE_PROVIDER_SITE_OTHER): Payer: BC Managed Care – PPO

## 2020-07-02 ENCOUNTER — Encounter (HOSPITAL_COMMUNITY): Payer: Self-pay | Admitting: Emergency Medicine

## 2020-07-02 DIAGNOSIS — M542 Cervicalgia: Secondary | ICD-10-CM | POA: Diagnosis not present

## 2020-07-02 DIAGNOSIS — R519 Headache, unspecified: Secondary | ICD-10-CM

## 2020-07-02 MED ORDER — CYCLOBENZAPRINE HCL 5 MG PO TABS: 5.0000 mg | ORAL_TABLET | Freq: Every day | ORAL | 0 refills | Status: AC

## 2020-07-02 MED ORDER — KETOROLAC TROMETHAMINE 30 MG/ML IJ SOLN
INTRAMUSCULAR | Status: AC
  Filled 2020-07-02: qty 1

## 2020-07-02 MED ORDER — KETOROLAC TROMETHAMINE 30 MG/ML IJ SOLN
30.0000 mg | Freq: Once | INTRAMUSCULAR | Status: AC
  Administered 2020-07-02: 30 mg via INTRAMUSCULAR
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
## 2020-07-02 NOTE — Discharge Instructions (Signed)
I would expect soreness for the next 2-3 weeks potentially. Tomorrow you may have more soreness than today.  Tylenol every 6-8 hours as needed.  Ice, heat as needed to help with pain.  Flexeril as needed as a muscle relaxer, particularly at night before bed.  I am hopeful that the medication today will help with your headache.  If worsening of headache, vision changes, dizziness, nausea or vomiting please go to the ER.
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
